# Patient Record
Sex: Female | Born: 1975
Health system: Southern US, Community
[De-identification: ages and names within clinical notes are randomized; demographics above are authoritative.]

## PROBLEM LIST (undated history)

## (undated) DIAGNOSIS — E039 Hypothyroidism, unspecified: Secondary | ICD-10-CM

## (undated) DIAGNOSIS — D649 Anemia, unspecified: Secondary | ICD-10-CM

## (undated) DIAGNOSIS — I10 Essential (primary) hypertension: Secondary | ICD-10-CM

## (undated) DIAGNOSIS — J45909 Unspecified asthma, uncomplicated: Secondary | ICD-10-CM

## (undated) DIAGNOSIS — T4145XA Adverse effect of unspecified anesthetic, initial encounter: Secondary | ICD-10-CM

## (undated) DIAGNOSIS — T8859XA Other complications of anesthesia, initial encounter: Secondary | ICD-10-CM

## (undated) HISTORY — PX: OTHER SURGICAL HISTORY: SHX169

---
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
## 2000-12-26 ENCOUNTER — Ambulatory Visit (HOSPITAL_COMMUNITY): Admission: RE | Admit: 2000-12-26 | Discharge: 2000-12-26 | Payer: Self-pay | Admitting: Ophthalmology

## 2000-12-26 ENCOUNTER — Encounter: Payer: Self-pay | Admitting: Ophthalmology

## 2001-04-09 ENCOUNTER — Ambulatory Visit (HOSPITAL_COMMUNITY): Admission: RE | Admit: 2001-04-09 | Discharge: 2001-04-09 | Payer: Self-pay | Admitting: Obstetrics & Gynecology

## 2001-10-19 ENCOUNTER — Other Ambulatory Visit: Admission: RE | Admit: 2001-10-19 | Discharge: 2001-10-19 | Payer: Self-pay | Admitting: Obstetrics & Gynecology

## 2002-05-28 ENCOUNTER — Other Ambulatory Visit: Admission: RE | Admit: 2002-05-28 | Discharge: 2002-05-28 | Payer: Self-pay | Admitting: Obstetrics & Gynecology

## 2002-12-06 ENCOUNTER — Inpatient Hospital Stay (HOSPITAL_COMMUNITY): Admission: AD | Admit: 2002-12-06 | Discharge: 2002-12-09 | Payer: Self-pay | Admitting: Obstetrics & Gynecology

## 2002-12-10 ENCOUNTER — Encounter: Admission: RE | Admit: 2002-12-10 | Discharge: 2003-01-09 | Payer: Self-pay | Admitting: Obstetrics & Gynecology

## 2003-01-10 ENCOUNTER — Other Ambulatory Visit: Admission: RE | Admit: 2003-01-10 | Discharge: 2003-01-10 | Payer: Self-pay | Admitting: Obstetrics & Gynecology

## 2004-08-17 ENCOUNTER — Inpatient Hospital Stay (HOSPITAL_COMMUNITY): Admission: AD | Admit: 2004-08-17 | Discharge: 2004-08-19 | Payer: Self-pay | Admitting: Obstetrics and Gynecology

## 2004-09-13 ENCOUNTER — Other Ambulatory Visit: Admission: RE | Admit: 2004-09-13 | Discharge: 2004-09-13 | Payer: Self-pay | Admitting: Obstetrics & Gynecology

## 2015-10-12 ENCOUNTER — Ambulatory Visit (INDEPENDENT_AMBULATORY_CARE_PROVIDER_SITE_OTHER): Payer: BLUE CROSS/BLUE SHIELD | Admitting: Sports Medicine

## 2015-10-12 VITALS — BP 134/93 | HR 93 | Resp 18 | Wt 253.0 lb

## 2015-10-12 DIAGNOSIS — Q828 Other specified congenital malformations of skin: Secondary | ICD-10-CM | POA: Diagnosis not present

## 2015-10-12 DIAGNOSIS — M2142 Flat foot [pes planus] (acquired), left foot: Secondary | ICD-10-CM | POA: Diagnosis not present

## 2015-10-12 DIAGNOSIS — M2141 Flat foot [pes planus] (acquired), right foot: Secondary | ICD-10-CM | POA: Diagnosis not present

## 2015-10-12 DIAGNOSIS — M2140 Flat foot [pes planus] (acquired), unspecified foot: Secondary | ICD-10-CM | POA: Insufficient documentation

## 2015-10-12 NOTE — Progress Notes (Signed)
    Patient was fitted for a : standard, cushioned, semi-rigid orthotic. The orthotic was heated and afterward the patient stood on the orthotic blank positioned on the orthotic stand. The patient was positioned in subtalar neutral position and 10 degrees of ankle dorsiflexion in a weight bearing stance. After completion of molding, a stable base was applied to the orthotic blank. The blank was ground to a stable position for weight bearing. Size: 9 Base: [employer] and safety inspector and Padding: None The patient ambulated these, and they were very comfortable.  Procedure:  Cryodestruction of multiple skin tags Consent obtained and verified. Time-out conducted. Noted no overlying erythema, induration, or other signs of local infection. Completed without difficulty using Cryo-Gun. Advised to call if fevers/chills, erythema, induration, drainage, or persistent bleeding.   I spent 40 minutes with this patient, greater than 50% was face-to-face time counseling regarding the below diagnosis, this was separate from the time spent performing the above procedure.

## 2015-10-12 NOTE — Assessment & Plan Note (Signed)
Custom orthotics as above. 

## 2015-10-12 NOTE — Assessment & Plan Note (Signed)
Cryotherapy of multiple skin tags on both sides of the face and neck

## 2015-10-20 ENCOUNTER — Ambulatory Visit (INDEPENDENT_AMBULATORY_CARE_PROVIDER_SITE_OTHER): Payer: BLUE CROSS/BLUE SHIELD

## 2015-10-20 ENCOUNTER — Ambulatory Visit (INDEPENDENT_AMBULATORY_CARE_PROVIDER_SITE_OTHER): Payer: BLUE CROSS/BLUE SHIELD | Admitting: Sports Medicine

## 2015-10-20 VITALS — BP 110/71 | HR 102 | Wt 258.8 lb

## 2015-10-20 DIAGNOSIS — M2142 Flat foot [pes planus] (acquired), left foot: Secondary | ICD-10-CM | POA: Diagnosis not present

## 2015-10-20 DIAGNOSIS — M170 Bilateral primary osteoarthritis of knee: Secondary | ICD-10-CM | POA: Diagnosis not present

## 2015-10-20 DIAGNOSIS — Q828 Other specified congenital malformations of skin: Secondary | ICD-10-CM | POA: Diagnosis not present

## 2015-10-20 DIAGNOSIS — M2141 Flat foot [pes planus] (acquired), right foot: Secondary | ICD-10-CM | POA: Diagnosis not present

## 2015-10-20 NOTE — Assessment & Plan Note (Signed)
Doing well with custom orthotics.

## 2015-10-20 NOTE — Assessment & Plan Note (Signed)
Scabbed over with a bit of postinflammatory hypopigmentation, otherwise doing well. I think these will look fantastic when fully healed.

## 2015-10-20 NOTE — Progress Notes (Signed)
  Subjective:    CC: Follow-up  HPI: Pes planus: All discomfort has resolved with custom orthotics.  Skin tags: After cryotherapy of multiple skin tags on the face, neck, there is some scabbing, overall doing well.  Left knee pain: Medial joint line, noticeable mechanical symptoms.no trauma, moderate, persistent, had an injection into her right knee about a month ago.  Past medical history, Surgical history, Family history not pertinant except as noted below, Social history, Allergies, and medications have been entered into the medical record, reviewed, and no changes needed.   Review of Systems: No fevers, chills, night sweats, weight loss, chest pain, or shortness of breath.   Objective:    General: Well Developed, well nourished, and in no acute distress.  Neuro: Alert and oriented x3, extra-ocular muscles intact, sensation grossly intact.  HEENT: Normocephalic, atraumatic, pupils equal round reactive to light, neck supple, no masses, no lymphadenopathy, thyroid nonpalpable. Visible scabbing of the skin tags, no sign of bacterial superinfection, there is some postinflammatory hypopigmentation. Skin: Warm and dry, no rashes. Cardiac: Regular rate and rhythm, no murmurs rubs or gallops, no lower extremity edema.  Respiratory: Clear to auscultation bilaterally. Not using accessory muscles, speaking in full sentences.  Impression and Recommendations:

## 2015-10-20 NOTE — Assessment & Plan Note (Signed)
Left knee injected approximately a month ago, doing well. Having some medial joint line pain on the right knee, suspicious for osteoarthritis with degenerative meniscal tear. X-rays, when she returns we will inject her right knee.

## 2016-09-24 DIAGNOSIS — M25562 Pain in left knee: Secondary | ICD-10-CM | POA: Diagnosis not present

## 2016-09-24 DIAGNOSIS — M25561 Pain in right knee: Secondary | ICD-10-CM | POA: Diagnosis not present

## 2016-09-24 DIAGNOSIS — R2689 Other abnormalities of gait and mobility: Secondary | ICD-10-CM | POA: Diagnosis not present

## 2016-09-24 DIAGNOSIS — G8929 Other chronic pain: Secondary | ICD-10-CM | POA: Diagnosis not present

## 2016-09-24 DIAGNOSIS — M6281 Muscle weakness (generalized): Secondary | ICD-10-CM | POA: Diagnosis not present

## 2016-11-25 ENCOUNTER — Ambulatory Visit (INDEPENDENT_AMBULATORY_CARE_PROVIDER_SITE_OTHER): Payer: Self-pay | Admitting: Orthopedic Surgery

## 2018-03-11 DIAGNOSIS — J029 Acute pharyngitis, unspecified: Secondary | ICD-10-CM | POA: Diagnosis not present

## 2018-05-01 DIAGNOSIS — E049 Nontoxic goiter, unspecified: Secondary | ICD-10-CM | POA: Diagnosis not present

## 2018-06-01 DIAGNOSIS — E041 Nontoxic single thyroid nodule: Secondary | ICD-10-CM | POA: Diagnosis not present

## 2018-08-25 ENCOUNTER — Ambulatory Visit (INDEPENDENT_AMBULATORY_CARE_PROVIDER_SITE_OTHER): Payer: BLUE CROSS/BLUE SHIELD | Admitting: Internal Medicine

## 2018-08-25 ENCOUNTER — Encounter: Payer: Self-pay | Admitting: Internal Medicine

## 2018-08-25 ENCOUNTER — Other Ambulatory Visit: Payer: Self-pay

## 2018-08-25 VITALS — BP 118/80 | HR 63 | Ht 66.0 in | Wt 218.4 lb

## 2018-08-25 DIAGNOSIS — Z9889 Other specified postprocedural states: Secondary | ICD-10-CM

## 2018-08-25 DIAGNOSIS — E041 Nontoxic single thyroid nodule: Secondary | ICD-10-CM | POA: Diagnosis not present

## 2018-08-25 LAB — TSH: TSH: 3.13 u[IU]/mL (ref 0.35–4.50)

## 2018-08-25 LAB — T4, FREE: Free T4: 0.74 ng/dL (ref 0.60–1.60)

## 2018-08-25 NOTE — Progress Notes (Signed)
Name: Denise Evans  MRN/ DOB: [id number], [birth year]    Age/ Sex: 43 y.o., female    PCP: Assunta Curtis, [place]  Reason for Endocrinology Evaluation: Thyroid nodule     Date of Initial Endocrinology Evaluation: 08/25/2018     HPI: [name] is a 43 y.o. female with a past medical history of Asthma, hypothyroidism, and HTN . The patient presented for initial endocrinology clinic visit on 08/25/2018 for consultative assistance with her thyroid nodule.   Patient was noted to have local neck enlargement on routine exam, this prompted a thyroid ultrasound which was performed on November,8th 2019, and revealed thyroid nodule requiring biopsy, images and report not available at this time. She did have an ultrasound guided FNA on 06/01/2018 of the right inferior thyroid nodule which is hypoechoic measuring 3.0 x 2.6 x 1.8 cm .  Cytology report consistent with atypia of undetermined significance Bethesda category III. it does not seem that the patient had molecular testing.  We have attempted to call the PCPs office multiple times today to obtain any information molecular testing or the actual cytology report without success.   Today she is denies any worsening in neck swelling, denies pain, voice hoarseness, or sob.  She does have occasional dysphagia.      HISTORY:   Past Medical History: Thyroid nodule  Past Surgical History:   Social History:  reports that she has never smoked. She has never used smokeless tobacco. She reports current alcohol use. She reports that she does not use drugs.  Family History: family history includes Diabetes in her father; Hypertension in her father; Uterine cancer in her mother.   HOME MEDICATIONS: Allergies as of 08/25/2018      Reactions   Norethindrone Other (See Comments)   Liver enzymes off      Medication List       Accurate as of August 25, 2018  3:55 PM. Always use your most recent med list.        meloxicam 15 MG tablet Commonly  known as:  MOBIC Take 15 mg by mouth daily.   phentermine 37.5 MG tablet Commonly known as:  ADIPEX-P TAKE 1 TABLET BY MOUTH ONCE DAILY PRIOR TO OR 1 TO 2 HOURS AFTER BREAKFAST         REVIEW OF SYSTEMS: A comprehensive ROS was conducted with the patient and is negative except as per HPI and below:  Review of Systems  Constitutional: Negative for fever.  HENT: Negative for congestion and sore throat.   Respiratory: Negative for cough and shortness of breath.   Cardiovascular: Negative for chest pain and palpitations.  Gastrointestinal: Positive for constipation and diarrhea.  Genitourinary: Negative for frequency.  Skin: Negative.   Neurological: Negative for tingling and tremors.  Endo/Heme/Allergies: Negative for polydipsia.  Psychiatric/Behavioral: Negative for depression. The patient is not nervous/anxious.        OBJECTIVE:  VS: BP 118/80 (BP Location: Left Arm, Patient Position: Sitting, Cuff Size: Normal)   Pulse 63   Ht 5\' 6"  (1.676 m)   Wt 218 lb 6.4 oz (99.1 kg)   SpO2 99%   BMI 35.25 kg/m    Wt Readings from Last 3 Encounters:  08/25/18 218 lb 6.4 oz (99.1 kg)  10/20/15 258 lb 12.8 oz (117.4 kg)  10/12/15 253 lb (114.8 kg)     EXAM: General: Pt appears well and is in NAD  Hydration: Well-hydrated with moist mucous membranes and good skin turgor  Eyes: External eye exam  normal without stare, lid lag or exophthalmos.  EOM intact.  PERRL.  Ears, Nose, Throat: Hearing: Grossly intact bilaterally Dental: Good dentition  Throat: Clear without mass, erythema or exudate  Neck: General: Supple without adenopathy. Thyroid: Thyroid size normal, right thyroid nodule noted.  No goiter appreciated. No thyroid bruit.  Lungs: Clear with good BS bilat with no rales, rhonchi, or wheezes  Heart: Auscultation: RRR.  Abdomen: Normoactive bowel sounds, soft, nontender, without masses or organomegaly palpable  Extremities:  BL LE: No pretibial edema normal ROM and  strength.  Skin: Hair: Texture and amount normal with gender appropriate distribution Skin Inspection: No rashes. Skin Palpation: Skin temperature, texture, and thickness normal to palpation  Neuro: Cranial nerves: II - XII grossly intact  Motor: Normal strength throughout DTRs: 2+ and symmetric in UE without delay in relaxation phase  Mental Status: Judgment, insight: Intact Orientation: Oriented to time, place, and person Mood and affect: No depression, anxiety, or agitation     DATA REVIEWED: 04/16/2018  FT4  0.70 ng/dL  T3 0.88 ng/mL  TSH  5.96 uIU/mL    Thyroid ultrasound 04/2018  Right mid solid nodule, isoechoic 2.9 x2.7x 2.1 cm   Impression : Heterogenous thyroid with TIAD 3 right thyroid nodule    Results for Denise, Evans (MRN [id number]) as of 08/26/2018 07:35  Ref. Range 08/25/2018 15:29  TSH Latest Ref Range: 0.35 - 4.50 uIU/mL 3.13  T4,Free(Direct) Latest Ref Range: 0.60 - 1.60 ng/dL 0.74     ASSESSMENT/PLAN/RECOMENDATIONS:   1. Right Thyroid Nodule :  -Clinically she is euthyroid -She does not have any local neck symptoms -Based on the latest ultrasound which was done the day of her FNA that thyroid nodule measurements was 3.0 x2.6x 1.8 cm in diameter, FNA report shows atypia, Bethesda category III. no molecular testing available. -I have discussed with her the option of repeating FNA with molecular testing this time.  Patient does not want to go through another biopsy and would prefer to have a right lobectomy.  Her mother passed away of uterine cancer and patient would like to eliminate any chances of cancer at this time. -We will refer patient to general surgery to discuss risk and benefits of right lobectomy. -Patient understand she will need a follow-up at our office within 6 weeks postop. -Repeat TFT's today are normal    Signed electronically by: Mack Guise, MD  [facility]  [facility] [address]  [place], [place] [address] Phone: [phone] FAX: [phone]   CC: Assunta Curtis, [address] [place] [address] Phone: [phone] Fax: [phone]   Return to Endocrinology clinic as below: Future Appointments  Date Time Provider [place]  11/03/2018  2:20 PM , Melanie Crazier, MD [facility] None

## 2018-08-25 NOTE — Patient Instructions (Signed)
-   Please stop by the lab today, we will contact you with any changes.   - We started a referral to the general surgeon, I will need to see you within 6 weeks of your surgery.

## 2018-08-26 ENCOUNTER — Encounter: Payer: Self-pay | Admitting: Internal Medicine

## 2018-08-26 DIAGNOSIS — E041 Nontoxic single thyroid nodule: Secondary | ICD-10-CM | POA: Insufficient documentation

## 2018-08-26 DIAGNOSIS — Z9889 Other specified postprocedural states: Secondary | ICD-10-CM | POA: Insufficient documentation

## 2018-09-29 DIAGNOSIS — D440 Neoplasm of uncertain behavior of thyroid gland: Secondary | ICD-10-CM | POA: Diagnosis not present

## 2018-11-02 ENCOUNTER — Telehealth: Payer: Self-pay

## 2018-11-02 NOTE — Telephone Encounter (Signed)
Left message for patient to call back and convert to virtual visit.  CRM created.

## 2018-11-03 ENCOUNTER — Other Ambulatory Visit: Payer: Self-pay

## 2018-11-03 ENCOUNTER — Encounter: Payer: Self-pay | Admitting: Internal Medicine

## 2018-11-03 ENCOUNTER — Ambulatory Visit (INDEPENDENT_AMBULATORY_CARE_PROVIDER_SITE_OTHER): Payer: BLUE CROSS/BLUE SHIELD | Admitting: Internal Medicine

## 2018-11-03 DIAGNOSIS — E041 Nontoxic single thyroid nodule: Secondary | ICD-10-CM | POA: Diagnosis not present

## 2018-11-03 DIAGNOSIS — Z9889 Other specified postprocedural states: Secondary | ICD-10-CM

## 2018-11-03 NOTE — Progress Notes (Signed)
Virtual Visit via Video Note  I connected with Denise Evans on 11/03/18 at  2:20 PM EDT by a video enabled telemedicine application and verified that I am speaking with the correct person using two identifiers.   I discussed the limitations of evaluation and management by telemedicine and the availability of in person appointments. The patient expressed understanding and agreed to proceed.  -Location of the patient : Work  -Location of the provider : Office -The names of all persons participating in the telemedicine service : Pt and myself    Name: Denise Evans  MRN/ DOB: [id number], Dec 17, 1975    Age/ Sex: 43 y.o., female     PCP: Assunta Curtis, [place]   Reason for Endocrinology Evaluation: Thyroid Nodule      Initial Endocrinology Clinic Visit: 08/25/2018    PATIENT IDENTIFIER: [name] is a 43 y.o., female with a past medical history of Asthma, and HTN . She has followed with [facility] since 08/25/2018 for consultative assistance with management of her  Thyroid Nodule.  HISTORICAL SUMMARY: The patient was noted to have local neck enlargement on routine exam, this prompted a thyroid ultrasound which was performed on November,8th 2019, and revealed thyroid nodule requiring biopsy, images and report not available at this time. She did have an ultrasound guided FNA on 06/01/2018 of the right inferior thyroid nodule which is hypoechoic measuring 3.0 x 2.6 x 1.8 cm .  Cytology report consistent with atypia of undetermined significance (Bethesda category III), no molecular testing available.   Pt opted for a right lobectomy vs repeat FNA.     SUBJECTIVE:   Today (11/03/2018):  Denise Evans is here for virtual 2 month follow up on right thyroid nodule.  She denies any worsening in neck swelling,  pain, or voice hoarseness. She has noted more fatigue then usual. She has chronic constipation.  Weight has been stable. Denies palpitations or depression.   ROS:  As  per HPI.   HISTORY:   Past Medical History: No past medical history on file. Past Surgical History:  Past Surgical History:  Procedure Laterality Date  . D & C    . hystrectomy       Social History:  reports that she has never smoked. She has never used smokeless tobacco. She reports current alcohol use. She reports that she does not use drugs. Family History:  Family History  Problem Relation Age of Onset  . Uterine cancer Mother   . Diabetes Father   . Hypertension Father       HOME MEDICATIONS: Allergies as of 11/03/2018      Reactions   Norethindrone Other (See Comments)   Liver enzymes off      Medication List       Accurate as of Nov 03, 2018  1:57 PM. If you have any questions, ask your nurse or doctor.        meloxicam 15 MG tablet Commonly known as:  MOBIC Take 15 mg by mouth daily.   phentermine 37.5 MG tablet Commonly known as:  ADIPEX-P TAKE 1 TABLET BY MOUTH ONCE DAILY PRIOR TO OR 1 TO 2 HOURS AFTER BREAKFAST          DATA REVIEWED:   Results for Denise Evans, Denise Evans (MRN [id number]) as of 11/03/2018 14:54  Ref. Range 08/25/2018 15:29  TSH Latest Ref Range: 0.35 - 4.50 uIU/mL 3.13  T4,Free(Direct) Latest Ref Range: 0.60 - 1.60 ng/dL 0.74    ASSESSMENT / PLAN / RECOMMENDATIONS:  1. Right Thyroid Nodule , S/P FNA with AUS  - Pt is clinically and biochemically euthyroid  - Based on the latest ultrasound which was done the day of her FNA that thyroid nodule measurements was 3.0 x2.6x 1.8 cm in diameter, FNA report shows atypia, Bethesda category III. no molecular testing available. - Pt opted to proceed with right lobectomy rather then repeat FNA, she met with Dr. Delana Meyer but due to COVID-19 restrictions, her surgery is on hold.  - No local neck symptoms at this time.     I discussed the assessment and treatment plan with the patient. The patient was provided an opportunity to ask questions and all were answered. The patient agreed with the  plan and demonstrated an understanding of the instructions.   The patient was advised to call back or seek an in-person evaluation if the symptoms worsen or if the condition fails to improve as anticipated.  F/u 6 weeks post-Op    Signed electronically by: Mack Guise, MD  [facility]  [facility] [address] [place], [place] [address] Phone: [phone] FAX: [phone]   CC: Assunta Curtis, [address] [place] [address] Phone: [phone] Fax: [phone]   Return to Endocrinology clinic as below: Future Appointments  Date Time Provider [place]  11/03/2018  2:20 PM Shamle[place], Melanie Crazier, MD [facility] None

## 2018-11-18 ENCOUNTER — Encounter: Payer: Self-pay | Admitting: Surgery

## 2018-11-18 ENCOUNTER — Ambulatory Visit: Payer: Self-pay | Admitting: Surgery

## 2018-11-18 DIAGNOSIS — D440 Neoplasm of uncertain behavior of thyroid gland: Secondary | ICD-10-CM | POA: Diagnosis present

## 2018-11-18 NOTE — H&P (Signed)
General Surgery [facility]  Denise Evans DOB: April 21, 1976 Married / Language: English / Race: Black or African American Female   History of Present Illness  The patient is a 43 year old female who presents with a thyroid nodule.  CHIEF COMPLAINT: right thyroid nodule with atypia  Patient is referred by Dr. Vivia Ewing for surgical evaluation and management of a dominant right thyroid nodule with cytologic atypia. Patient's primary care is provided by Candace Gallus, NP, in [place], [place]. Patient was found in the fall of 2019 to have a palpable thyroid nodule on physical examination. Patient subsequently underwent thyroid ultrasound at the [facility] in [place]. This demonstrated a 2.9 x 2.7 x 2.1 cm solid nodule in the mid right thyroid lobe. This was felt to be mildly suspicious and biopsy was recommended. On June 02, 2018 the patient underwent fine-needle aspiration biopsy. This showed cytologic atypia of undetermined significance. Patient was referred to endocrinology and evaluated. She is now referred to surgery for consideration for surgical resection for definitive diagnosis and management. Patient did not have molecular genetic testing. Patient has had no prior history of head or neck surgery. She has taken thyroid hormone in the past for polycystic ovarian syndrome. She has been off of that medication now for many years. There is no family history of thyroid disease. Patient does note some mild compressive symptoms with occasional dysphagia to both solids and liquids. She notes occasional voice quality changes. She is very aware of the cosmetic appearance with fullness in the right anterior neck. She presents today to discuss possible surgical removal.   Past Surgical History  Hysterectomy (not due to cancer) - Complete   Diagnostic Studies History  Colonoscopy  never Mammogram  1-3 years ago Pap Smear  1-5 years  ago  Allergies  Norethindrone *CHEMICALS*   Medication History  No Current Medications Medications Reconciled  Social History  Alcohol use  Occasional alcohol use. Caffeine use  Coffee. No drug use  Tobacco use  Never smoker.  Family History  Alcohol Abuse  Brother, Father. Bleeding disorder  Mother. Cancer  Mother. Diabetes Mellitus  Father, Mother. Heart Disease  Father, Mother. Heart disease in female family member before age 5  Hypertension  Father, Mother. Kidney Disease  Mother.  Pregnancy / Birth History  Age at menarche  48 years. Gravida  5 Irregular periods  Length (months) of breastfeeding  3-6 Maternal age  58-20 Para  2  Other Problems Asthma  Heart murmur   Review of Systems General Present- Weight Gain. Not Present- Appetite Loss, Chills, Fatigue, Fever, Night Sweats and Weight Loss. Skin Present- Dryness. Not Present- Change in Wart/Mole, Hives, Jaundice, New Lesions, Non-Healing Wounds, Rash and Ulcer. HEENT Present- Seasonal Allergies. Not Present- Earache, Hearing Loss, Hoarseness, Nose Bleed, Oral Ulcers, Ringing in the Ears, Sinus Pain, Sore Throat, Visual Disturbances, Wears glasses/contact lenses and Yellow Eyes. Respiratory Not Present- Bloody sputum, Chronic Cough, Difficulty Breathing, Snoring and Wheezing. Breast Not Present- Breast Mass, Breast Pain, Nipple Discharge and Skin Changes. Cardiovascular Not Present- Chest Pain, Difficulty Breathing Lying Down, Leg Cramps, Palpitations, Rapid Heart Rate, Shortness of Breath and Swelling of Extremities. Gastrointestinal Not Present- Abdominal Pain, Bloating, Bloody Stool, Change in Bowel Habits, Chronic diarrhea, Constipation, Difficulty Swallowing, Excessive gas, Gets full quickly at meals, Hemorrhoids, Indigestion, Nausea, Rectal Pain and Vomiting. Female Genitourinary Not Present- Frequency, Nocturia, Painful Urination, Pelvic Pain and Urgency. Musculoskeletal Not Present-  Back Pain, Joint Pain, Joint Stiffness, Muscle Pain, Muscle Weakness  and Swelling of Extremities. Neurological Not Present- Decreased Memory, Fainting, Headaches, Numbness, Seizures, Tingling, Tremor, Trouble walking and Weakness. Psychiatric Not Present- Anxiety, Bipolar, Change in Sleep Pattern, Depression, Fearful and Frequent crying. Endocrine Not Present- Cold Intolerance, Excessive Hunger, Hair Changes, Heat Intolerance, Hot flashes and New Diabetes. Hematology Not Present- Blood Thinners, Easy Bruising, Excessive bleeding, Gland problems, HIV and Persistent Infections.  Vitals  Weight: 223.13 lb Height: 66in Body Surface Area: 2.1 m Body Mass Index: 36.01 kg/m  Temp.: 99.31F  Pulse: 81 (Regular)  P.OX: 99% (Room air) BP: 130/80 (Sitting, Left Arm, Standard)   Physical Exam   See vital signs recorded above  GENERAL APPEARANCE Development: normal Nutritional status: normal Gross deformities: none  SKIN Rash, lesions, ulcers: none Induration, erythema: none Nodules: none palpable  EYES Conjunctiva and lids: normal Pupils: equal and reactive Iris: normal bilaterally  EARS, NOSE, MOUTH, THROAT External ears: no lesion or deformity External nose: no lesion or deformity Hearing: grossly normal Lips: no lesion or deformity Dentition: normal for age Oral mucosa: moist  NECK Symmetric: no Trachea: midline Thyroid: There is visible fullness in the right anterior neck. Palpation of the right lobe shows a firm discrete nodule measuring approximately 3 cm in size, mobile with swallowing, and nontender. Left thyroid lobe is without palpable abnormality. No palpable lymphadenopathy.  CHEST Respiratory effort: normal Retraction or accessory muscle use: no Breath sounds: normal bilaterally Rales, rhonchi, wheeze: none  CARDIOVASCULAR Auscultation: regular rhythm, normal rate Murmurs: none Pulses: carotid and radial pulse 2+ palpable Lower extremity edema:  none Lower extremity varicosities: none  MUSCULOSKELETAL Station and gait: normal Digits and nails: no clubbing or cyanosis Muscle strength: grossly normal all extremities Range of motion: grossly normal all extremities Deformity: none  LYMPHATIC Cervical: none palpable Supraclavicular: none palpable  PSYCHIATRIC Oriented to person, place, and time: yes Mood and affect: normal for situation Judgment and insight: appropriate for situation    Assessment & Plan  NEOPLASM OF UNCERTAIN BEHAVIOR OF THYROID GLAND (D44.0)  Pt Education - Pamphlet Given - The Thyroid Book: discussed with patient and provided information.  Patient presents on referral from her endocrinologist for evaluation for surgery for management of right thyroid nodule with cytologic atypia. Patient is provided with written literature on thyroid surgery to review at home.  Patient has a dominant right sided thyroid nodule measuring 2.9 cm in greatest dimension. Patient has some mild compressive symptoms. Cytopathology demonstrates atypia. We discussed further options for management including repeat biopsy for molecular genetic testing versus proceeding directly to surgery. Patient states that given the mild compressive symptoms and the cosmetic appearance, she prefers to have surgery for definitive diagnosis. We discussed the risk and benefits of the procedure including the risk of injury to recurrent laryngeal nerve and parathyroid glands. We discussed the potential need for completion thyroidectomy if she undergoes right thyroid lobectomy as the initial procedure. We discussed the potential need for radioactive iodine treatment based on final pathology results. We discussed the location of the surgical incision. We discussed the hospital stay to be anticipated. We discussed her postoperative recovery and return to work and activities. Patient understands and wishes to proceed with surgery in the near  future.  Due to the current virus pandemic, surgical procedures of this tight our currently on hold. We will submit orders and placed the patient in the queue for surgical scheduling as soon as the operating rooms are available for this type of procedure. Patient understands.  The risks and benefits of the procedure  have been discussed at length with the patient. The patient understands the proposed procedure, potential alternative treatments, and the course of recovery to be expected. All of the patient's questions have been answered at this time. The patient wishes to proceed with surgery.   Armandina Gemma, [facility] Office: [phone]

## 2018-12-29 ENCOUNTER — Other Ambulatory Visit (HOSPITAL_COMMUNITY)
Admission: RE | Admit: 2018-12-29 | Discharge: 2018-12-29 | Disposition: A | Discharge status: Home / Self Care | Payer: BC Managed Care – PPO | Source: Ambulatory Visit | Attending: Surgery | Admitting: Surgery

## 2018-12-29 DIAGNOSIS — Z1159 Encounter for screening for other viral diseases: Secondary | ICD-10-CM | POA: Diagnosis not present

## 2018-12-29 DIAGNOSIS — Z01812 Encounter for preprocedural laboratory examination: Secondary | ICD-10-CM | POA: Diagnosis not present

## 2018-12-29 LAB — SARS CORONAVIRUS 2 (TAT 6-24 HRS): SARS Coronavirus 2: NEGATIVE

## 2018-12-29 NOTE — Patient Instructions (Addendum)
YOU HAD A COVID 19 TEST ON 12-29-2018,  ONCE YOUR COVID TEST IS COMPLETED, PLEASE BEGIN THE QUARANTINE INSTRUCTIONS AS OUTLINED IN YOUR HANDOUT.                Raeanne Legate    Your procedure is scheduled on: 01-01-2019   Report to [facility] Main  Entrance    Report to  [place] at 530 AM    Call this number if you have problems the morning of surgery [phone]    Remember: Do not eat food or drink liquids :After Midnight.    Take these medicines the morning of surgery with A SIP OF WATER: None   BRUSH YOUR TEETH MORNING OF SURGERY AND RINSE YOUR MOUTH OUT, NO CHEWING GUM CANDY OR MINTS.                                You may not have any metal on your body including hair pins and              piercings    Do not wear jewelry, makeup,  lotions, powders or deodorant               Do not wear nail polish.  Do not shave  48 hours prior to surgery.                Do not bring valuables to the hospital. [place].  Contacts, dentures or bridgework may not be worn into surgery.       _____________________________________________________________________             [facility] - Preparing for Surgery Before surgery, you can play an important role.  Because skin is not sterile, your skin needs to be as free of germs as possible.  You can reduce the number of germs on your skin by washing with CHG (chlorahexidine gluconate) soap before surgery.  CHG is an antiseptic cleaner which kills germs and bonds with the skin to continue killing germs even after washing. Please DO NOT use if you have an allergy to CHG or antibacterial soaps.  If your skin becomes reddened/irritated stop using the CHG and inform your nurse when you arrive at Short Stay. Do not shave (including legs and underarms) for at least 48 hours prior to the first CHG shower.  You may shave your face/neck. Please follow these instructions carefully:  1.   Shower with CHG Soap the night before surgery and the  morning of Surgery.  2.  If you choose to wash your hair, wash your hair first as usual with your  normal  shampoo.  3.  After you shampoo, rinse your hair and body thoroughly to remove the  shampoo.                           4.  Use CHG as you would any other liquid soap.  You can apply chg directly  to the skin and wash                       Gently with a scrungie or clean washcloth.  5.  Apply the CHG Soap to your body ONLY FROM THE NECK DOWN.   Do not use on face/  open                           Wound or open sores. Avoid contact with eyes, ears mouth and genitals (private parts).                       Wash face,  Genitals (private parts) with your normal soap.             6.  Wash thoroughly, paying special attention to the area where your surgery  will be performed.  7.  Thoroughly rinse your body with warm water from the neck down.  8.  DO NOT shower/wash with your normal soap after using and rinsing off  the CHG Soap.                9.  Pat yourself dry with a clean towel.            10.  Wear clean pajamas.            11.  Place clean sheets on your bed the night of your first shower and do not  sleep with pets. Day of Surgery : Do not apply any lotions/deodorants the morning of surgery.  Please wear clean clothes to the hospital/surgery center.  FAILURE TO FOLLOW THESE INSTRUCTIONS MAY RESULT IN THE CANCELLATION OF YOUR SURGERY PATIENT SIGNATURE_________________________________  NURSE SIGNATURE__________________________________  ________________________________________________________________________

## 2018-12-30 ENCOUNTER — Encounter (HOSPITAL_COMMUNITY): Payer: Self-pay

## 2018-12-30 ENCOUNTER — Encounter (HOSPITAL_COMMUNITY)
Admission: RE | Admit: 2018-12-30 | Discharge: 2018-12-30 | Disposition: A | Discharge status: Home / Self Care | Payer: BC Managed Care – PPO | Source: Ambulatory Visit | Attending: Surgery | Admitting: Surgery

## 2018-12-30 ENCOUNTER — Other Ambulatory Visit: Payer: Self-pay

## 2018-12-30 ENCOUNTER — Encounter (HOSPITAL_COMMUNITY): Payer: Self-pay | Admitting: Surgery

## 2018-12-30 ENCOUNTER — Ambulatory Visit (HOSPITAL_COMMUNITY)
Admission: RE | Admit: 2018-12-30 | Discharge: 2018-12-30 | Disposition: A | Discharge status: Home / Self Care | Payer: BC Managed Care – PPO | Source: Ambulatory Visit | Attending: Anesthesiology | Admitting: Anesthesiology

## 2018-12-30 DIAGNOSIS — Z01811 Encounter for preprocedural respiratory examination: Secondary | ICD-10-CM | POA: Insufficient documentation

## 2018-12-30 HISTORY — DX: Other complications of anesthesia, initial encounter: T88.59XA

## 2018-12-30 HISTORY — DX: Essential (primary) hypertension: I10

## 2018-12-30 HISTORY — DX: Hypothyroidism, unspecified: E03.9

## 2018-12-30 HISTORY — DX: Unspecified asthma, uncomplicated: J45.909

## 2018-12-30 HISTORY — DX: Anemia, unspecified: D64.9

## 2018-12-30 LAB — CBC
HCT: 41.9 % (ref 36.0–46.0)
Hemoglobin: 13.1 g/dL (ref 12.0–15.0)
MCH: 29 pg (ref 26.0–34.0)
MCHC: 31.3 g/dL (ref 30.0–36.0)
MCV: 92.9 fL (ref 80.0–100.0)
Platelets: 222 10*3/uL (ref 150–400)
RBC: 4.51 MIL/uL (ref 3.87–5.11)
RDW: 13.7 % (ref 11.5–15.5)
WBC: 6.3 10*3/uL (ref 4.0–10.5)
nRBC: 0 % (ref 0.0–0.2)

## 2018-12-30 NOTE — H&P (Signed)
General Surgery [facility]  Lainie Overacker DOB: 10-03-1975 Married / Language: English / Race: Black or African American Female   History of Present Illness  The patient is a 43 year old female who presents with a thyroid nodule.  CHIEF COMPLAINT: right thyroid nodule with atypia  Patient is referred by Dr. Vivia Ewing for surgical evaluation and management of a dominant right thyroid nodule with cytologic atypia. Patient's primary care is provided by Candace Gallus, NP, in [place], [place]. Patient was found in the fall of 2019 to have a palpable thyroid nodule on physical examination. Patient subsequently underwent thyroid ultrasound at the [facility] in [place]. This demonstrated a 2.9 x 2.7 x 2.1 cm solid nodule in the mid right thyroid lobe. This was felt to be mildly suspicious and biopsy was recommended. On June 02, 2018 the patient underwent fine-needle aspiration biopsy. This showed cytologic atypia of undetermined significance. Patient was referred to endocrinology and evaluated. She is now referred to surgery for consideration for surgical resection for definitive diagnosis and management. Patient did not have molecular genetic testing. Patient has had no prior history of head or neck surgery. She has taken thyroid hormone in the past for polycystic ovarian syndrome. She has been off of that medication now for many years. There is no family history of thyroid disease. Patient does note some mild compressive symptoms with occasional dysphagia to both solids and liquids. She notes occasional voice quality changes. She is very aware of the cosmetic appearance with fullness in the right anterior neck. She presents today to discuss possible surgical removal.   Past Surgical History Hysterectomy (not due to cancer) - Complete   Diagnostic Studies History Colonoscopy  never Mammogram  1-3 years ago Pap Smear  1-5 years  ago  Allergies  Norethindrone *CHEMICALS*   Medication History No Current Medications Medications Reconciled  Social History  Alcohol use  Occasional alcohol use. Caffeine use  Coffee. No drug use  Tobacco use  Never smoker.  Family History Alcohol Abuse  Brother, Father. Bleeding disorder  Mother. Cancer  Mother. Diabetes Mellitus  Father, Mother. Heart Disease  Father, Mother. Heart disease in female family member before age 95  Hypertension  Father, Mother. Kidney Disease  Mother.  Pregnancy / Birth History Age at menarche  42 years. Gravida  5 Irregular periods  Length (months) of breastfeeding  3-6 Maternal age  71-20 Para  2  Other Problems Asthma  Heart murmur   Review of Systems General Present- Weight Gain. Not Present- Appetite Loss, Chills, Fatigue, Fever, Night Sweats and Weight Loss. Skin Present- Dryness. Not Present- Change in Wart/Mole, Hives, Jaundice, New Lesions, Non-Healing Wounds, Rash and Ulcer. HEENT Present- Seasonal Allergies. Not Present- Earache, Hearing Loss, Hoarseness, Nose Bleed, Oral Ulcers, Ringing in the Ears, Sinus Pain, Sore Throat, Visual Disturbances, Wears glasses/contact lenses and Yellow Eyes. Respiratory Not Present- Bloody sputum, Chronic Cough, Difficulty Breathing, Snoring and Wheezing. Breast Not Present- Breast Mass, Breast Pain, Nipple Discharge and Skin Changes. Cardiovascular Not Present- Chest Pain, Difficulty Breathing Lying Down, Leg Cramps, Palpitations, Rapid Heart Rate, Shortness of Breath and Swelling of Extremities. Gastrointestinal Not Present- Abdominal Pain, Bloating, Bloody Stool, Change in Bowel Habits, Chronic diarrhea, Constipation, Difficulty Swallowing, Excessive gas, Gets full quickly at meals, Hemorrhoids, Indigestion, Nausea, Rectal Pain and Vomiting. Female Genitourinary Not Present- Frequency, Nocturia, Painful Urination, Pelvic Pain and Urgency. Musculoskeletal Not Present-  Back Pain, Joint Pain, Joint Stiffness, Muscle Pain, Muscle Weakness and Swelling of Extremities. Neurological  Not Present- Decreased Memory, Fainting, Headaches, Numbness, Seizures, Tingling, Tremor, Trouble walking and Weakness. Psychiatric Not Present- Anxiety, Bipolar, Change in Sleep Pattern, Depression, Fearful and Frequent crying. Endocrine Not Present- Cold Intolerance, Excessive Hunger, Hair Changes, Heat Intolerance, Hot flashes and New Diabetes. Hematology Not Present- Blood Thinners, Easy Bruising, Excessive bleeding, Gland problems, HIV and Persistent Infections.  Vitals Weight: 223.13 lb Height: 66in Body Surface Area: 2.1 m Body Mass Index: 36.01 kg/m  Temp.: 99.4F  Pulse: 81 (Regular)  P.OX: 99% (Room air) BP: 130/80(Sitting, Left Arm, Standard)  Physical Exam  See vital signs recorded above  GENERAL APPEARANCE Development: normal Nutritional status: normal Gross deformities: none  SKIN Rash, lesions, ulcers: none Induration, erythema: none Nodules: none palpable  EYES Conjunctiva and lids: normal Pupils: equal and reactive Iris: normal bilaterally  EARS, NOSE, MOUTH, THROAT External ears: no lesion or deformity External nose: no lesion or deformity Hearing: grossly normal Lips: no lesion or deformity Dentition: normal for age Oral mucosa: moist  NECK Symmetric: no Trachea: midline Thyroid: There is visible fullness in the right anterior neck. Palpation of the right lobe shows a firm discrete nodule measuring approximately 3 cm in size, mobile with swallowing, and nontender. Left thyroid lobe is without palpable abnormality. No palpable lymphadenopathy.  CHEST Respiratory effort: normal Retraction or accessory muscle use: no Breath sounds: normal bilaterally Rales, rhonchi, wheeze: none  CARDIOVASCULAR Auscultation: regular rhythm, normal rate Murmurs: none Pulses: carotid and radial pulse 2+ palpable Lower extremity edema:  none Lower extremity varicosities: none  MUSCULOSKELETAL Station and gait: normal Digits and nails: no clubbing or cyanosis Muscle strength: grossly normal all extremities Range of motion: grossly normal all extremities Deformity: none  LYMPHATIC Cervical: none palpable Supraclavicular: none palpable  PSYCHIATRIC Oriented to person, place, and time: yes Mood and affect: normal for situation Judgment and insight: appropriate for situation    Assessment & Plan   NEOPLASM OF UNCERTAIN BEHAVIOR OF THYROID GLAND (D44.0)  Pt Education - Pamphlet Given - The Thyroid Book: discussed with patient and provided information. Patient presents on referral from her endocrinologist for evaluation for surgery for management of right thyroid nodule with cytologic atypia. Patient is provided with written literature on thyroid surgery to review at home.  Patient has a dominant right sided thyroid nodule measuring 2.9 cm in greatest dimension. Patient has some mild compressive symptoms. Cytopathology demonstrates atypia. We discussed further options for management including repeat biopsy for molecular genetic testing versus proceeding directly to surgery. Patient states that given the mild compressive symptoms and the cosmetic appearance, she prefers to have surgery for definitive diagnosis. We discussed the risk and benefits of the procedure including the risk of injury to recurrent laryngeal nerve and parathyroid glands. We discussed the potential need for completion thyroidectomy if she undergoes right thyroid lobectomy as the initial procedure. We discussed the potential need for radioactive iodine treatment based on final pathology results. We discussed the location of the surgical incision. We discussed the hospital stay to be anticipated. We discussed her postoperative recovery and return to work and activities. Patient understands and wishes to proceed with surgery in the near  future.  Due to the current virus pandemic, surgical procedures of this tight our currently on hold. We will submit orders and placed the patient in the queue for surgical scheduling as soon as the operating rooms are available for this type of procedure. Patient understands.  The risks and benefits of the procedure have been discussed at length with the patient. The  patient understands the proposed procedure, potential alternative treatments, and the course of recovery to be expected. All of the patient's questions have been answered at this time. The patient wishes to proceed with surgery.   Armandina Gemma, [facility] Office: [phone]

## 2018-12-31 NOTE — Anesthesia Preprocedure Evaluation (Addendum)
Anesthesia Evaluation  Patient identified by MRN, date of birth, ID band Patient awake    Reviewed: Allergy & Precautions, NPO status , Patient's Chart, lab work & pertinent test results  Airway Mallampati: II       Dental no notable dental hx. (+) Teeth Intact   Pulmonary asthma ,    Pulmonary exam normal breath sounds clear to auscultation       Cardiovascular hypertension, Normal cardiovascular exam Rhythm:Regular Rate:Normal     Neuro/Psych negative neurological ROS  negative psych ROS   GI/Hepatic negative GI ROS, Neg liver ROS,   Endo/Other  Hypothyroidism   Renal/GU      Musculoskeletal   Abdominal (+) + obese,   Peds  Hematology   Anesthesia Other Findings   Reproductive/Obstetrics                            Anesthesia Physical Anesthesia Plan  ASA: II  Anesthesia Plan: General   Post-op Pain Management:    Induction: Intravenous  PONV Risk Score and Plan: 4 or greater and Ondansetron, Dexamethasone, Midazolam and Scopolamine patch - Pre-op  Airway Management Planned: Oral ETT  Additional Equipment:   Intra-op Plan:   Post-operative Plan: Extubation in OR  Informed Consent: I have reviewed the patients History and Physical, chart, labs and discussed the procedure including the risks, benefits and alternatives for the proposed anesthesia with the patient or authorized representative who has indicated his/her understanding and acceptance.     Dental advisory given  Plan Discussed with: CRNA  Anesthesia Plan Comments:        Anesthesia Quick Evaluation

## 2019-01-01 ENCOUNTER — Other Ambulatory Visit: Payer: Self-pay

## 2019-01-01 ENCOUNTER — Ambulatory Visit (HOSPITAL_COMMUNITY): Payer: BC Managed Care – PPO | Admitting: Physician Assistant

## 2019-01-01 ENCOUNTER — Ambulatory Visit (HOSPITAL_COMMUNITY): Payer: BC Managed Care – PPO | Admitting: Anesthesiology

## 2019-01-01 ENCOUNTER — Encounter (HOSPITAL_COMMUNITY): Payer: Self-pay | Admitting: Emergency Medicine

## 2019-01-01 ENCOUNTER — Encounter (HOSPITAL_COMMUNITY)
Admission: RE | Disposition: A | Discharge status: Home / Self Care | Payer: Self-pay | Source: Home / Self Care | Attending: Surgery

## 2019-01-01 ENCOUNTER — Observation Stay (HOSPITAL_COMMUNITY)
Admission: RE | Admit: 2019-01-01 | Discharge: 2019-01-02 | Disposition: A | Discharge status: Home / Self Care | Payer: BC Managed Care – PPO | Attending: Surgery | Admitting: Surgery

## 2019-01-01 DIAGNOSIS — Z888 Allergy status to other drugs, medicaments and biological substances status: Secondary | ICD-10-CM | POA: Diagnosis not present

## 2019-01-01 DIAGNOSIS — D440 Neoplasm of uncertain behavior of thyroid gland: Secondary | ICD-10-CM | POA: Diagnosis not present

## 2019-01-01 DIAGNOSIS — E049 Nontoxic goiter, unspecified: Secondary | ICD-10-CM | POA: Insufficient documentation

## 2019-01-01 DIAGNOSIS — D34 Benign neoplasm of thyroid gland: Secondary | ICD-10-CM | POA: Diagnosis not present

## 2019-01-01 DIAGNOSIS — Z79899 Other long term (current) drug therapy: Secondary | ICD-10-CM | POA: Diagnosis not present

## 2019-01-01 DIAGNOSIS — E039 Hypothyroidism, unspecified: Secondary | ICD-10-CM | POA: Diagnosis not present

## 2019-01-01 DIAGNOSIS — E041 Nontoxic single thyroid nodule: Secondary | ICD-10-CM | POA: Diagnosis present

## 2019-01-01 DIAGNOSIS — I10 Essential (primary) hypertension: Secondary | ICD-10-CM | POA: Diagnosis not present

## 2019-01-01 DIAGNOSIS — J45909 Unspecified asthma, uncomplicated: Secondary | ICD-10-CM | POA: Diagnosis not present

## 2019-01-01 DIAGNOSIS — R1310 Dysphagia, unspecified: Secondary | ICD-10-CM | POA: Diagnosis not present

## 2019-01-01 DIAGNOSIS — M170 Bilateral primary osteoarthritis of knee: Secondary | ICD-10-CM | POA: Diagnosis not present

## 2019-01-01 HISTORY — PX: THYROID LOBECTOMY: SHX420

## 2019-01-01 LAB — BASIC METABOLIC PANEL
Anion gap: 8 (ref 5–15)
BUN: 13 mg/dL (ref 6–20)
CO2: 24 mmol/L (ref 22–32)
Calcium: 8.8 mg/dL — ABNORMAL LOW (ref 8.9–10.3)
Chloride: 106 mmol/L (ref 98–111)
Creatinine, Ser: 0.79 mg/dL (ref 0.44–1.00)
GFR calc Af Amer: >60 mL/min (ref >60)
GFR calc non Af Amer: >60 mL/min (ref >60)
Glucose, Bld: 99 mg/dL (ref 70–99)
Potassium: 4 mmol/L (ref 3.5–5.1)
Sodium: 138 mmol/L (ref 135–145)

## 2019-01-01 SURGERY — LOBECTOMY, THYROID
Anesthesia: General | Laterality: Right

## 2019-01-01 MED ORDER — EPHEDRINE 5 MG/ML INJ
INTRAVENOUS | Status: AC
  Filled 2019-01-01: qty 10

## 2019-01-01 MED ORDER — PROPOFOL 10 MG/ML IV BOLUS
INTRAVENOUS | Status: AC
  Filled 2019-01-01: qty 40

## 2019-01-01 MED ORDER — PROMETHAZINE HCL 25 MG/ML IJ SOLN: 6.2500 mg | INTRAMUSCULAR | Status: DC | PRN

## 2019-01-01 MED ORDER — ACETAMINOPHEN 650 MG RE SUPP: 650.0000 mg | Freq: Four times a day (QID) | RECTAL | Status: DC | PRN

## 2019-01-01 MED ORDER — PHENYLEPHRINE 40 MCG/ML (10ML) SYRINGE FOR IV PUSH (FOR BLOOD PRESSURE SUPPORT)
PREFILLED_SYRINGE | INTRAVENOUS | Status: DC | PRN
  Administered 2019-01-01 (×3): 80 ug via INTRAVENOUS

## 2019-01-01 MED ORDER — ROCURONIUM BROMIDE 10 MG/ML (PF) SYRINGE
PREFILLED_SYRINGE | INTRAVENOUS | Status: DC | PRN
  Administered 2019-01-01: 70 mg via INTRAVENOUS

## 2019-01-01 MED ORDER — FENTANYL CITRATE (PF) 100 MCG/2ML IJ SOLN
INTRAMUSCULAR | Status: AC
  Filled 2019-01-01: qty 2

## 2019-01-01 MED ORDER — HYDROCHLOROTHIAZIDE 12.5 MG PO CAPS
12.5000 mg | ORAL_CAPSULE | Freq: Every day | ORAL | Status: DC
  Administered 2019-01-01: 12.5 mg via ORAL
  Filled 2019-01-01: qty 1

## 2019-01-01 MED ORDER — ALBUTEROL SULFATE HFA 108 (90 BASE) MCG/ACT IN AERS
INHALATION_SPRAY | RESPIRATORY_TRACT | Status: DC | PRN
  Administered 2019-01-01: 4 via RESPIRATORY_TRACT
  Administered 2019-01-01: 6 via RESPIRATORY_TRACT

## 2019-01-01 MED ORDER — EPHEDRINE SULFATE-NACL 50-0.9 MG/10ML-% IV SOSY
PREFILLED_SYRINGE | INTRAVENOUS | Status: DC | PRN
  Administered 2019-01-01: 10 mg via INTRAVENOUS
  Administered 2019-01-01: 5 mg via INTRAVENOUS
  Administered 2019-01-01: 10 mg via INTRAVENOUS

## 2019-01-01 MED ORDER — HYDROCODONE-ACETAMINOPHEN 5-325 MG PO TABS
1.0000 | ORAL_TABLET | ORAL | Status: DC | PRN
  Administered 2019-01-01: 1 via ORAL
  Filled 2019-01-01: qty 1

## 2019-01-01 MED ORDER — LIDOCAINE 2% (20 MG/ML) 5 ML SYRINGE
INTRAMUSCULAR | Status: DC | PRN
  Administered 2019-01-01: 100 mg via INTRAVENOUS

## 2019-01-01 MED ORDER — LISINOPRIL 10 MG PO TABS: 10.0000 mg | ORAL_TABLET | Freq: Every day | ORAL | Status: DC

## 2019-01-01 MED ORDER — CEFAZOLIN SODIUM-DEXTROSE 2-4 GM/100ML-% IV SOLN
2.0000 g | INTRAVENOUS | Status: AC
  Administered 2019-01-01: 2 g via INTRAVENOUS
  Filled 2019-01-01: qty 100

## 2019-01-01 MED ORDER — ONDANSETRON HCL 4 MG/2ML IJ SOLN
INTRAMUSCULAR | Status: DC | PRN
  Administered 2019-01-01: 4 mg via INTRAVENOUS

## 2019-01-01 MED ORDER — MEPERIDINE HCL 50 MG/ML IJ SOLN: 6.2500 mg | INTRAMUSCULAR | Status: DC | PRN

## 2019-01-01 MED ORDER — LACTATED RINGERS IV SOLN
INTRAVENOUS | Status: DC
  Administered 2019-01-01: 06:00:00 via INTRAVENOUS

## 2019-01-01 MED ORDER — PROMETHAZINE HCL 25 MG/ML IJ SOLN
12.5000 mg | INTRAMUSCULAR | Status: DC | PRN
  Administered 2019-01-01: 12.5 mg via INTRAVENOUS
  Filled 2019-01-01: qty 1

## 2019-01-01 MED ORDER — ALBUTEROL SULFATE HFA 108 (90 BASE) MCG/ACT IN AERS
INHALATION_SPRAY | RESPIRATORY_TRACT | Status: AC
  Filled 2019-01-01: qty 6.7

## 2019-01-01 MED ORDER — SUGAMMADEX SODIUM 500 MG/5ML IV SOLN
INTRAVENOUS | Status: DC | PRN
  Administered 2019-01-01: 400 mg via INTRAVENOUS

## 2019-01-01 MED ORDER — HYDROMORPHONE HCL 1 MG/ML IJ SOLN
0.2500 mg | INTRAMUSCULAR | Status: DC | PRN
  Administered 2019-01-01 (×2): 0.5 mg via INTRAVENOUS

## 2019-01-01 MED ORDER — DEXAMETHASONE SODIUM PHOSPHATE 10 MG/ML IJ SOLN
INTRAMUSCULAR | Status: DC | PRN
  Administered 2019-01-01: 10 mg via INTRAVENOUS

## 2019-01-01 MED ORDER — SUGAMMADEX SODIUM 500 MG/5ML IV SOLN
INTRAVENOUS | Status: AC
  Filled 2019-01-01: qty 5

## 2019-01-01 MED ORDER — KETOROLAC TROMETHAMINE 30 MG/ML IJ SOLN: 30.0000 mg | Freq: Once | INTRAMUSCULAR | Status: DC | PRN

## 2019-01-01 MED ORDER — PHENYLEPHRINE 40 MCG/ML (10ML) SYRINGE FOR IV PUSH (FOR BLOOD PRESSURE SUPPORT)
PREFILLED_SYRINGE | INTRAVENOUS | Status: AC
  Filled 2019-01-01: qty 10

## 2019-01-01 MED ORDER — PROPOFOL 10 MG/ML IV BOLUS
INTRAVENOUS | Status: DC | PRN
  Administered 2019-01-01: 180 mg via INTRAVENOUS

## 2019-01-01 MED ORDER — SCOPOLAMINE 1 MG/3DAYS TD PT72
MEDICATED_PATCH | TRANSDERMAL | Status: DC | PRN
  Administered 2019-01-01: 1 via TRANSDERMAL

## 2019-01-01 MED ORDER — LISINOPRIL 10 MG PO TABS
10.0000 mg | ORAL_TABLET | Freq: Every day | ORAL | Status: DC
  Administered 2019-01-01: 22:00:00 10 mg via ORAL
  Filled 2019-01-01: qty 1

## 2019-01-01 MED ORDER — ONDANSETRON HCL 4 MG/2ML IJ SOLN
4.0000 mg | Freq: Four times a day (QID) | INTRAMUSCULAR | Status: DC | PRN
  Administered 2019-01-01: 4 mg via INTRAVENOUS
  Filled 2019-01-01: qty 2

## 2019-01-01 MED ORDER — 0.9 % SODIUM CHLORIDE (POUR BTL) OPTIME
TOPICAL | Status: DC | PRN
  Administered 2019-01-01: 1000 mL

## 2019-01-01 MED ORDER — MIDAZOLAM HCL 5 MG/5ML IJ SOLN
INTRAMUSCULAR | Status: DC | PRN
  Administered 2019-01-01: 2 mg via INTRAVENOUS

## 2019-01-01 MED ORDER — FENTANYL CITRATE (PF) 100 MCG/2ML IJ SOLN
INTRAMUSCULAR | Status: DC | PRN
  Administered 2019-01-01: 100 ug via INTRAVENOUS
  Administered 2019-01-01 (×2): 50 ug via INTRAVENOUS

## 2019-01-01 MED ORDER — CHLORHEXIDINE GLUCONATE CLOTH 2 % EX PADS: 6.0000 | MEDICATED_PAD | Freq: Once | CUTANEOUS | Status: DC

## 2019-01-01 MED ORDER — HYDROMORPHONE HCL 1 MG/ML IJ SOLN
1.0000 mg | INTRAMUSCULAR | Status: DC | PRN
  Administered 2019-01-01: 11:00:00 1 mg via INTRAVENOUS
  Filled 2019-01-01: qty 1

## 2019-01-01 MED ORDER — MIDAZOLAM HCL 2 MG/2ML IJ SOLN
INTRAMUSCULAR | Status: AC
  Filled 2019-01-01: qty 2

## 2019-01-01 MED ORDER — KCL IN DEXTROSE-NACL 20-5-0.45 MEQ/L-%-% IV SOLN
INTRAVENOUS | Status: DC
  Administered 2019-01-01 – 2019-01-02 (×2): via INTRAVENOUS
  Filled 2019-01-01 (×2): qty 1000

## 2019-01-01 MED ORDER — HYDROCHLOROTHIAZIDE 12.5 MG PO CAPS: 12.5000 mg | ORAL_CAPSULE | Freq: Every day | ORAL | Status: DC

## 2019-01-01 MED ORDER — ONDANSETRON 4 MG PO TBDP: 4.0000 mg | ORAL_TABLET | Freq: Four times a day (QID) | ORAL | Status: DC | PRN

## 2019-01-01 MED ORDER — ALBUTEROL SULFATE (2.5 MG/3ML) 0.083% IN NEBU: 2.5000 mg | INHALATION_SOLUTION | Freq: Four times a day (QID) | RESPIRATORY_TRACT | Status: DC | PRN

## 2019-01-01 MED ORDER — TRAMADOL HCL 50 MG PO TABS: 50.0000 mg | ORAL_TABLET | Freq: Four times a day (QID) | ORAL | Status: DC | PRN

## 2019-01-01 MED ORDER — SCOPOLAMINE 1 MG/3DAYS TD PT72
MEDICATED_PATCH | TRANSDERMAL | Status: AC
  Filled 2019-01-01: qty 1

## 2019-01-01 MED ORDER — ACETAMINOPHEN 325 MG PO TABS: 650.0000 mg | ORAL_TABLET | Freq: Four times a day (QID) | ORAL | Status: DC | PRN

## 2019-01-01 MED ORDER — HYDROMORPHONE HCL 1 MG/ML IJ SOLN
INTRAMUSCULAR | Status: AC
  Filled 2019-01-01: qty 1

## 2019-01-01 SURGICAL SUPPLY — 31 items
ADH SKN CLS APL DERMABOND .7 (GAUZE/BANDAGES/DRESSINGS) ×1
APL PRP STRL LF DISP 70% ISPRP (MISCELLANEOUS) ×2
ATTRACTOMAT 16X20 MAGNETIC DRP (DRAPES) ×3 IMPLANT
BLADE SURG 15 STRL LF DISP TIS (BLADE) ×1 IMPLANT
BLADE SURG 15 STRL SS (BLADE) ×3
CHLORAPREP W/TINT 26 (MISCELLANEOUS) ×6 IMPLANT
CLIP VESOCCLUDE MED 6/CT (CLIP) ×6 IMPLANT
CLIP VESOCCLUDE SM WIDE 6/CT (CLIP) ×8 IMPLANT
COVER SURGICAL LIGHT HANDLE (MISCELLANEOUS) ×3 IMPLANT
COVER WAND RF STERILE (DRAPES) ×1 IMPLANT
DERMABOND ADVANCED (GAUZE/BANDAGES/DRESSINGS) ×2
DERMABOND ADVANCED .7 DNX12 (GAUZE/BANDAGES/DRESSINGS) IMPLANT
DRAPE LAPAROTOMY T 98X78 PEDS (DRAPES) ×3 IMPLANT
ELECT PENCIL ROCKER SW 15FT (MISCELLANEOUS) ×3 IMPLANT
ELECT REM PT RETURN 15FT ADLT (MISCELLANEOUS) ×3 IMPLANT
GAUZE 4X4 16PLY RFD (DISPOSABLE) ×3 IMPLANT
GLOVE SURG ORTHO 8.0 STRL STRW (GLOVE) ×3 IMPLANT
GOWN STRL REUS W/TWL XL LVL3 (GOWN DISPOSABLE) ×6 IMPLANT
HEMOSTAT SURGICEL 2X4 FIBR (HEMOSTASIS) ×2 IMPLANT
ILLUMINATOR WAVEGUIDE N/F (MISCELLANEOUS) ×2 IMPLANT
KIT BASIN OR (CUSTOM PROCEDURE TRAY) ×3 IMPLANT
KIT TURNOVER KIT A (KITS) IMPLANT
PACK BASIC VI WITH GOWN DISP (CUSTOM PROCEDURE TRAY) ×3 IMPLANT
SHEARS HARMONIC 9CM CVD (BLADE) ×3 IMPLANT
SUT MNCRL AB 4-0 PS2 18 (SUTURE) ×3 IMPLANT
SUT VIC AB 3-0 SH 18 (SUTURE) ×6 IMPLANT
SYR BULB IRRIGATION 50ML (SYRINGE) ×3 IMPLANT
TOWEL OR 17X26 10 PK STRL BLUE (TOWEL DISPOSABLE) ×3 IMPLANT
TOWEL OR NON WOVEN STRL DISP B (DISPOSABLE) ×3 IMPLANT
TUBING CONNECTING 10 (TUBING) ×2 IMPLANT
TUBING CONNECTING 10' (TUBING) ×1

## 2019-01-01 NOTE — Op Note (Signed)
Procedure Note  Pre-operative Diagnosis:  Right thyroid neoplasm of uncertain behavior  Post-operative Diagnosis:  same  Surgeon:  Armandina Gemma, MD  Assistant:  none   Procedure:  Right thyroid lobectomy  Anesthesia:  General  Estimated Blood Loss:  minimal  Drains: none         Specimen: thyroid lobe to pathology  Indications:  Patient is referred by Dr. Vivia Ewing for surgical evaluation and management of a dominant right thyroid nodule with cytologic atypia. Patient's primary care is provided by Candace Gallus, NP, in [place], [place]. Patient was found in the fall of 2019 to have a palpable thyroid nodule on physical examination. Patient subsequently underwent thyroid ultrasound at the [facility] in [place]. This demonstrated a 2.9 x 2.7 x 2.1 cm solid nodule in the mid right thyroid lobe. This was felt to be mildly suspicious and biopsy was recommended. On June 02, 2018 the patient underwent fine-needle aspiration biopsy. This showed cytologic atypia of undetermined significance. Patient was referred to endocrinology and evaluated. She is now referred to surgery for consideration for surgical resection for definitive diagnosis and management. Patient did not have molecular genetic testing. Patient has had no prior history of head or neck surgery. She has taken thyroid hormone in the past for polycystic ovarian syndrome. She has been off of that medication now for many years. There is no family history of thyroid disease. Patient does note some mild compressive symptoms with occasional dysphagia to both solids and liquids. She notes occasional voice quality changes. She is very aware of the cosmetic appearance with fullness in the right anterior neck. She presents today to discuss possible surgical removal.  Procedure Details: Procedure was done in OR #1 at the [facility]. The patient was brought to the operating room and placed in a supine  position on the operating room table. Following administration of general anesthesia, the patient was positioned and then prepped and draped in the usual aseptic fashion. After ascertaining that an adequate level of anesthesia had been achieved, a small Kocher incision was made with #15 blade. Dissection was carried through subcutaneous tissues and platysma. Hemostasis was achieved with the electrocautery. Skin flaps were elevated cephalad and caudad from the thyroid notch to the sternal notch. A self-retaining retractor was placed for exposure. Strap muscles were incised in the midline and dissection was begun on the right side. Strap muscles were reflected laterally. The right thyroid lobe was moderately enlarged, soft, with a dominant 2.5 - 3.0 cm nodule. The lobe was gently mobilized with blunt dissection. Superior pole vessels were dissected out and divided individually between small and medium ligaclips with the harmonic scalpel. The thyroid lobe was rolled anteriorly. Branches of the inferior thyroid artery were divided between small ligaclips with the harmonic scalpel. Inferior venous tributaries were divided between ligaclips. Both the superior and inferior parathyroid glands were identified and preserved on their vascular pedicles. The recurrent laryngeal nerve was identified and preserved along its course. The ligament of Gwenlyn Found was released with the electrocautery and the gland was mobilized onto the anterior trachea. Isthmus was mobilized across the midline. There was no pyramidal lobe present. The thyroid parenchyma was transected at the junction of the isthmus and contralateral thyroid lobe with the harmonic scalpel. The thyroid lobe and isthmus were submitted to pathology for review.  The neck was irrigated with warm saline. Fibrillar was placed throughout the operative field. Strap muscles were approximated in the midline with interrupted 3-0 Vicryl sutures. Platysma was closed  with interrupted 3-0  Vicryl sutures. Skin was closed with a running 4-0 Monocryl subcuticular suture.  Wound was washed and dried and Dermabond was applied. The patient was awakened from anesthesia and brought to the recovery room. The patient tolerated the procedure well.   Armandina Gemma, MD [facility] Office: [phone]

## 2019-01-01 NOTE — Transfer of Care (Signed)
Immediate Anesthesia Transfer of Care Note  Patient: Denise Evans  Procedure(s) Performed: Procedure(s): RIGHT THYROID LOBECTOMY (Right)  Patient Location: PACU  Anesthesia Type:General  Level of Consciousness:  sedated, patient cooperative and responds to stimulation  Airway & Oxygen Therapy:Patient Spontanous Breathing and Patient connected to face mask oxgen  Post-op Assessment:  Report given to PACU RN and Post -op Vital signs reviewed and stable  Post vital signs:  Reviewed and stable  Last Vitals:  Vitals:   01/01/19 0541  BP: 127/82  Pulse: 76  Resp: 14  Temp: 36.8 C  SpO2: 67%    Complications: No apparent anesthesia complications

## 2019-01-01 NOTE — Anesthesia Postprocedure Evaluation (Signed)
Anesthesia Post Note  Patient: Denise Evans  Procedure(s) Performed: RIGHT THYROID LOBECTOMY (Right )     Patient location during evaluation: PACU Anesthesia Type: General Level of consciousness: sedated Pain management: pain level controlled Vital Signs Assessment: post-procedure vital signs reviewed and stable Respiratory status: spontaneous breathing Cardiovascular status: stable Postop Assessment: no apparent nausea or vomiting Anesthetic complications: no    Last Vitals:  Vitals:   01/01/19 1305 01/01/19 1439  BP: (!) 156/101 131/69  Pulse: 69 70  Resp: 20 18  Temp: 36.8 C 36.9 C  SpO2: 100% 100%    Last Pain:  Vitals:   01/01/19 1439  TempSrc: Oral  PainSc:    Pain Goal: Patients Stated Pain Goal: 3 (01/01/19 1031)                 Huston Foley

## 2019-01-01 NOTE — Interval H&P Note (Signed)
History and Physical Interval Note:  01/01/2019 6:59 AM  Denise Evans  has presented today for surgery, with the diagnosis of THYROID NEOPLASM OF UNCERTAIN BEHAVIOR.  The various methods of treatment have been discussed with the patient and family. After consideration of risks, benefits and other options for treatment, the patient has consented to  Procedure(s): RIGHT THYROID LOBECTOMY (Right) as a surgical intervention.  The patient's history has been reviewed, patient examined, no change in status, stable for surgery.  I have reviewed the patient's chart and labs.  Questions were answered to the patient's satisfaction.     Armandina Gemma

## 2019-01-01 NOTE — Interval H&P Note (Signed)
History and Physical Interval Note:  01/01/2019 6:58 AM  Denise Evans  has presented today for surgery, with the diagnosis of THYROID NEOPLASM OF UNCERTAIN BEHAVIOR.  The various methods of treatment have been discussed with the patient and family. After consideration of risks, benefits and other options for treatment, the patient has consented to  Procedure(s): RIGHT THYROID LOBECTOMY (Right) as a surgical intervention.  The patient's history has been reviewed, patient examined, no change in status, stable for surgery.  I have reviewed the patient's chart and labs.  Questions were answered to the patient's satisfaction.     Armandina Gemma

## 2019-01-01 NOTE — Anesthesia Procedure Notes (Signed)
Procedure Name: Intubation Date/Time: 01/01/2019 7:30 AM Performed by: Lavina Hamman, CRNA Pre-anesthesia Checklist: Patient identified, Emergency Drugs available, Suction available, Patient being monitored and Timeout performed Patient Re-evaluated:Patient Re-evaluated prior to induction Oxygen Delivery Method: Circle system utilized Preoxygenation: Pre-oxygenation with 100% oxygen Induction Type: IV induction Ventilation: Mask ventilation without difficulty Laryngoscope Size: Mac and 3 Grade View: Grade I Tube type: Oral Tube size: 7.0 mm Number of attempts: 1 Airway Equipment and Method: Stylet Placement Confirmation: ETT inserted through vocal cords under direct vision,  positive ETCO2,  CO2 detector and breath sounds checked- equal and bilateral Secured at: 21 cm Tube secured with: Tape Dental Injury: Teeth and Oropharynx as per pre-operative assessment  Comments: Peak pressures 38 after intubation, albuterol given with good results. ATOI.

## 2019-01-02 ENCOUNTER — Encounter (HOSPITAL_COMMUNITY): Payer: Self-pay | Admitting: Surgery

## 2019-01-02 DIAGNOSIS — E039 Hypothyroidism, unspecified: Secondary | ICD-10-CM | POA: Diagnosis not present

## 2019-01-02 DIAGNOSIS — J45909 Unspecified asthma, uncomplicated: Secondary | ICD-10-CM | POA: Diagnosis not present

## 2019-01-02 DIAGNOSIS — Z888 Allergy status to other drugs, medicaments and biological substances status: Secondary | ICD-10-CM | POA: Diagnosis not present

## 2019-01-02 DIAGNOSIS — R1310 Dysphagia, unspecified: Secondary | ICD-10-CM | POA: Diagnosis not present

## 2019-01-02 DIAGNOSIS — E049 Nontoxic goiter, unspecified: Secondary | ICD-10-CM | POA: Diagnosis not present

## 2019-01-02 DIAGNOSIS — E041 Nontoxic single thyroid nodule: Secondary | ICD-10-CM | POA: Diagnosis not present

## 2019-01-02 DIAGNOSIS — D34 Benign neoplasm of thyroid gland: Secondary | ICD-10-CM | POA: Diagnosis not present

## 2019-01-02 DIAGNOSIS — Z79899 Other long term (current) drug therapy: Secondary | ICD-10-CM | POA: Diagnosis not present

## 2019-01-02 DIAGNOSIS — I10 Essential (primary) hypertension: Secondary | ICD-10-CM | POA: Diagnosis not present

## 2019-01-02 MED ORDER — HYDROCODONE-ACETAMINOPHEN 5-325 MG PO TABS: 1.0000 | ORAL_TABLET | ORAL | 0 refills | Status: DC | PRN

## 2019-01-02 NOTE — Progress Notes (Signed)
PIV removed without difficulty. Discharge instructions reviewed with pt and printed copy provided. Pt denies any questions or concerns. Pt discharged home with husband

## 2019-01-02 NOTE — Discharge Summary (Signed)
Physician Discharge Summary [facility]  Patient ID: Denise Evans MRN: [id number] DOB/AGE: 1975-11-02 43 y.o.  Admit date: 01/01/2019 Discharge date: 01/02/2019  Admission Diagnoses:  Right thyroid nodule  Discharge Diagnoses:  Principal Problem:   Neoplasm of uncertain behavior of thyroid gland Active Problems:   Thyroid nodule   Discharged Condition: good  Hospital Course: Patient was admitted for observation following thyroid surgery.  Post op course was uncomplicated.  Pain was well controlled.  Tolerated diet.  Patient was prepared for discharge home on POD#1.  Consults: None  Treatments: surgery: right thyroid lobectomy  Discharge Exam: Blood pressure 127/89, pulse (!) 58, temperature 98.5 F (36.9 C), temperature source Oral, resp. rate 18, height 5\' 5"  (1.651 m), weight 102.3 kg, SpO2 96 %. HEENT - clear Neck - wound dry and intact; Dermabond in place; minimal swelling; voice normal Chest - clear bilaterally Cor - RRR   Disposition: Home  Discharge Instructions    Diet - low sodium heart healthy   Complete by: As directed    Discharge instructions   Complete by: As directed    [facility]  THYROID & PARATHYROID SURGERY:  POST-OP INSTRUCTIONS  Always review your discharge instruction sheet from the facility where your surgery was performed.  A prescription for pain medication may be given to you upon discharge.  Take your pain medication as prescribed.  If narcotic pain medicine is not needed, then you may take acetaminophen (Tylenol) or ibuprofen (Advil) as needed.  Take your usually prescribed medications unless otherwise directed.  If you need a refill on your pain medication, please contact our office during regular business hours.  Prescriptions cannot be processed by our office after 5 pm or on weekends.  Start with a light diet upon arrival home, such as soup and crackers or toast.  Be sure to drink plenty  of fluids daily.  Resume your normal diet the day after surgery.  Most patients will experience some swelling and bruising on the chest and neck area.  Ice packs will help.  Swelling and bruising can take several days to resolve.   It is common to experience some constipation after surgery.  Increasing fluid intake and taking a stool softener (Colace) will usually help or prevent this problem.  A mild laxative (Milk of Magnesia or Miralax) should be taken according to package directions if there has been no bowel movement after 48 hours.  You have steri-strips and a gauze dressing over your incision.  You may remove the gauze bandage on the second day after surgery, and you may shower at that time.  Leave your steri-strips (small skin tapes) in place directly over the incision.  These strips should remain on the skin for 5-7 days and then be removed.  You may get them wet in the shower and pat them dry.  You may resume regular (light) daily activities beginning the next day (such as daily self-care, walking, climbing stairs) gradually increasing activities as tolerated.  You may have sexual intercourse when it is comfortable.  Refrain from any heavy lifting or straining until approved by your doctor.  You may drive when you no longer are taking prescription pain medication, you can comfortably wear a seatbelt, and you can safely maneuver your car and apply brakes.  You should see your doctor in the office for a follow-up appointment approximately three weeks after your surgery.  Make sure that you call for this appointment within a day or  two after you arrive home to insure a convenient appointment time.  WHEN TO CALL YOUR DOCTOR: -- Fever greater than 101.5 -- Inability to urinate -- Nausea and/or vomiting - persistent -- Extreme swelling or bruising -- Continued bleeding from incision -- Increased pain, redness, or drainage from the incision -- Difficulty swallowing or breathing -- Muscle  cramping or spasms -- Numbness or tingling in hands or around lips  The clinic staff is available to answer your questions during regular business hours.  Please don't hesitate to call and ask to speak to one of the nurses if you have concerns.  Armandina Gemma, MD [facility] Office: [phone]   Increase activity slowly   Complete by: As directed    No dressing needed   Complete by: As directed      Allergies as of 01/02/2019      Reactions   Norethindrone Other (See Comments)   Liver enzymes off      Medication List    TAKE these medications   albuterol 108 (90 Base) MCG/ACT inhaler Commonly known as: VENTOLIN HFA Inhale 1-2 puffs into the lungs every 6 (six) hours as needed for wheezing or shortness of breath.   hydrochlorothiazide 12.5 MG capsule Commonly known as: MICROZIDE Take 12.5 mg by mouth daily.   HYDROcodone-acetaminophen 5-325 MG tablet Commonly known as: NORCO/VICODIN Take 1-2 tablets by mouth every 4 (four) hours as needed for moderate pain.   lisinopril 10 MG tablet Commonly known as: ZESTRIL Take 10 mg by mouth daily.      Follow-up Information    Armandina Gemma, MD. Schedule an appointment as soon as possible for a visit in 3 week(s).   Specialty: General Surgery Contact information: [address] [place] [address] [phone]           Earnstine Regal, MD, [facility] Office: [phone]   Signed: Armandina Gemma 01/02/2019, 8:02 AM

## 2019-01-04 ENCOUNTER — Telehealth: Payer: Self-pay | Admitting: Internal Medicine

## 2019-01-04 NOTE — Telephone Encounter (Signed)
-----   Message from Cloyd Stagers, MD sent at 01/01/2019  2:32 PM EDT ----- Please set her up to see me in 6 weeks for post-surgical hypothyroidism   Please notify the pt    Thanks

## 2019-01-07 NOTE — Progress Notes (Signed)
Please contact patient and notify of benign pathology results.  Lucresia Simic M. Marisabel Macpherson, MD, FACS [facility] Office: [phone]   

## 2019-02-01 ENCOUNTER — Other Ambulatory Visit: Payer: Self-pay

## 2019-02-01 ENCOUNTER — Ambulatory Visit (HOSPITAL_COMMUNITY)
Admission: EM | Admit: 2019-02-01 | Discharge: 2019-02-01 | Disposition: A | Discharge status: Home / Self Care | Payer: BC Managed Care – PPO | Attending: Internal Medicine | Admitting: Internal Medicine

## 2019-02-01 DIAGNOSIS — Z20828 Contact with and (suspected) exposure to other viral communicable diseases: Secondary | ICD-10-CM | POA: Diagnosis not present

## 2019-02-01 DIAGNOSIS — Z20822 Contact with and (suspected) exposure to covid-19: Secondary | ICD-10-CM

## 2019-02-01 NOTE — ED Provider Notes (Signed)
[place]    CSN: [id number] Arrival date & time: 02/01/19  1324      History   Chief Complaint Chief Complaint  Patient presents with  . covid testing    HPI Denise Evans is a 43 y.o. female with history of asthma controlled with an albuterol inhaler, hypertension on hydrochlorothiazide comes to urgent care with concerns for COVID-19.  Patient had exposure to somebody diagnosed with COVID-19.  Patient denies any shortness of breath, cough, wheezing, fever or chills.   No change in taste or smelling.  HPI  Past Medical History:  Diagnosis Date  . Anemia   . Asthma   . Complication of anesthesia    Scarring of the esophagus  . Hypertension   . Hypothyroidism     Patient Active Problem List   Diagnosis Date Noted  . Neoplasm of uncertain behavior of thyroid gland 11/18/2018  . S/P fine needle biopsy 08/26/2018  . Thyroid nodule 08/26/2018  . Primary osteoarthritis of both knees 10/20/2015  . Pes planus 10/12/2015  . Accessory skin tags 10/12/2015    Past Surgical History:  Procedure Laterality Date  . D & C    . hystrectomy    . THYROID LOBECTOMY Right 01/01/2019   Procedure: RIGHT THYROID LOBECTOMY;  Surgeon: Armandina Gemma, MD;  Location: [facility];  Service: General;  Laterality: Right;    OB History   No obstetric history on file.      Home Medications    Prior to Admission medications   Medication Sig Start Date End Date Taking? Authorizing Provider  albuterol (VENTOLIN HFA) 108 (90 Base) MCG/ACT inhaler Inhale 1-2 puffs into the lungs every 6 (six) hours as needed for wheezing or shortness of breath.    [provider]  hydrochlorothiazide (MICROZIDE) 12.5 MG capsule Take 12.5 mg by mouth daily.    [provider]  HYDROcodone-acetaminophen (NORCO/VICODIN) 5-325 MG tablet Take 1-2 tablets by mouth every 4 (four) hours as needed for moderate pain. Patient not taking: Reported on 02/01/2019 01/02/19   Armandina Gemma, MD   lisinopril (ZESTRIL) 10 MG tablet Take 10 mg by mouth daily.    [provider]    Family History Family History  Problem Relation Age of Onset  . Uterine cancer Mother   . Diabetes Father   . Hypertension Father     Social History Social History   Tobacco Use  . Smoking status: Never Smoker  . Smokeless tobacco: Never Used  Substance Use Topics  . Alcohol use: Yes    Comment: occas  . Drug use: Never     Allergies   Norethindrone   Review of Systems Review of Systems  Constitutional: Negative.   HENT: Negative.   Respiratory: Negative.   Cardiovascular: Negative.   Gastrointestinal: Negative.   Genitourinary: Negative.      Physical Exam Triage Vital Signs ED Triage Vitals [02/01/19 1422]  Enc Vitals Group     BP (!) 145/90     Pulse Rate 60     Resp 18     Temp 98 F (36.7 C)     Temp Source Temporal     SpO2 99 %     Weight      Height      Head Circumference      Peak Flow      Pain Score 0     Pain Loc      Pain Edu?      Excl. in [place]?  No data found.  Updated Vital Signs BP (!) 145/90 (BP Location: Right Arm)   Pulse 60   Temp 98 F (36.7 C) (Temporal)   Resp 18   SpO2 99%   Visual Acuity Right Eye Distance:   Left Eye Distance:   Bilateral Distance:    Right Eye Near:   Left Eye Near:    Bilateral Near:     Physical Exam Constitutional:      Appearance: She is not ill-appearing.  Cardiovascular:     Rate and Rhythm: Normal rate and regular rhythm.     Pulses: Normal pulses.     Heart sounds: Normal heart sounds.  Pulmonary:     Effort: Pulmonary effort is normal.     Breath sounds: Normal breath sounds.  Abdominal:     General: Bowel sounds are normal.     Palpations: Abdomen is soft.  Musculoskeletal: Normal range of motion.  Skin:    General: Skin is warm.     Capillary Refill: Capillary refill takes less than 2 seconds.  Neurological:     Mental Status: She is alert.      UC Treatments / Results   Labs (all labs ordered are listed, but only abnormal results are displayed) Labs Reviewed - No data to display  EKG   Radiology No results found.  Procedures Procedures (including critical care time)  Medications Ordered in UC Medications - No data to display  Initial Impression / Assessment and Plan / UC Course  I have reviewed the triage vital signs and the nursing notes.  Pertinent labs & imaging results that were available during my care of the patient were reviewed by me and considered in my medical decision making (see chart for details).     1.  Close exposure to COVID-19 patient: COVID-19 testing done Patient currently has no symptoms She is advised to self isolate until the results of COVID-19 is available Return to urgent care for reevaluation if patient has any symptoms. Final Clinical Impressions(s) / UC Diagnoses   Final diagnoses:  None   Discharge Instructions   None    ED Prescriptions    None     Controlled Substance Prescriptions [place] Controlled Substance Registry consulted? No   Chase Picket, MD 02/03/19 1454

## 2019-02-01 NOTE — ED Triage Notes (Signed)
Pt here for covid testing after sts she was exposed to person who tested positive; pt denies sx

## 2019-02-03 LAB — NOVEL CORONAVIRUS, NAA (HOSP ORDER, SEND-OUT TO REF LAB; TAT 18-24 HRS): SARS-CoV-2, NAA: NOT DETECTED

## 2019-02-05 ENCOUNTER — Encounter (HOSPITAL_COMMUNITY): Payer: Self-pay

## 2019-03-05 ENCOUNTER — Other Ambulatory Visit: Payer: Self-pay | Admitting: Internal Medicine

## 2019-03-05 DIAGNOSIS — E041 Nontoxic single thyroid nodule: Secondary | ICD-10-CM

## 2019-03-08 ENCOUNTER — Other Ambulatory Visit: Payer: Self-pay

## 2019-03-08 ENCOUNTER — Other Ambulatory Visit (INDEPENDENT_AMBULATORY_CARE_PROVIDER_SITE_OTHER): Payer: BC Managed Care – PPO

## 2019-03-08 DIAGNOSIS — E041 Nontoxic single thyroid nodule: Secondary | ICD-10-CM

## 2019-03-08 LAB — BASIC METABOLIC PANEL
BUN: 11 mg/dL (ref 6–23)
CO2: 26 mEq/L (ref 19–32)
Calcium: 8.9 mg/dL (ref 8.4–10.5)
Chloride: 104 mEq/L (ref 96–112)
Creatinine, Ser: 1.01 mg/dL (ref 0.40–1.20)
GFR: 72.21 mL/min (ref >60.00)
Glucose, Bld: 96 mg/dL (ref 70–99)
Potassium: 4.1 mEq/L (ref 3.5–5.1)
Sodium: 137 mEq/L (ref 135–145)

## 2019-03-08 LAB — TSH: TSH: 25.71 u[IU]/mL — ABNORMAL HIGH (ref 0.35–4.50)

## 2019-03-08 LAB — ALBUMIN: Albumin: 3.7 g/dL (ref 3.5–5.2)

## 2019-03-08 LAB — T4, FREE: Free T4: 0.66 ng/dL (ref 0.60–1.60)

## 2019-03-11 ENCOUNTER — Ambulatory Visit (INDEPENDENT_AMBULATORY_CARE_PROVIDER_SITE_OTHER): Payer: BC Managed Care – PPO | Admitting: Internal Medicine

## 2019-03-11 ENCOUNTER — Other Ambulatory Visit: Payer: Self-pay

## 2019-03-11 ENCOUNTER — Encounter: Payer: Self-pay | Admitting: Internal Medicine

## 2019-03-11 VITALS — BP 118/84 | HR 70 | Temp 98.9°F | Ht 66.0 in | Wt 231.0 lb

## 2019-03-11 DIAGNOSIS — E890 Postprocedural hypothyroidism: Secondary | ICD-10-CM

## 2019-03-11 MED ORDER — LEVOTHYROXINE SODIUM 50 MCG PO TABS: 50.0000 ug | ORAL_TABLET | Freq: Every day | ORAL | 3 refills | Status: DC

## 2019-03-11 NOTE — Patient Instructions (Signed)

## 2019-03-11 NOTE — Progress Notes (Signed)
Name: Denise Evans  MRN/ DOB: [id number], Feb 03, 1976    Age/ Sex: 43 y.o., female     PCP: Assunta Curtis, [place]   Reason for Endocrinology Evaluation: Thyroid Nodule      Initial Endocrinology Clinic Visit: 08/25/2018    PATIENT IDENTIFIER: Denise Evans is a 43 y.o., female with a past medical history of Asthma, and HTN . She has followed with [facility] since 08/25/2018 for consultative assistance with management of her  Thyroid Nodule.  HISTORICAL SUMMARY: The patient was noted to have local neck enlargement on routine exam, this prompted a thyroid ultrasound which was performed on November,8th 2019, and revealed thyroid nodule requiring biopsy, images and report not available at this time. She did have an ultrasound guided FNA on 06/01/2018 of the right inferior thyroid nodule which is hypoechoic measuring 3.0 x 2.6 x 1.8 cm .  Cytology report consistent with atypia of undetermined significance (Bethesda category III), no molecular testing available.    S/P right lobectomy 01/01/2019  SUBJECTIVE:   Today (03/11/2019):  Denise Evans is here for a 6 weeks follow up Post-OP  Following a right thyroid lobectomy.  Her post-op course was uncomplicated She has sensitivity around the incision  She has noted weight gain  Has cold intolerance which is chronic She has chronic constipation  Denies depression  No recent fever.    ROS:  As per HPI.   HISTORY:  Past Medical History:  Past Medical History:  Diagnosis Date  . Anemia   . Asthma   . Complication of anesthesia    Scarring of the esophagus  . Hypertension   . Hypothyroidism    Past Surgical History:  Past Surgical History:  Procedure Laterality Date  . D & C    . hystrectomy    . THYROID LOBECTOMY Right 01/01/2019   Procedure: RIGHT THYROID LOBECTOMY;  Surgeon: Armandina Gemma, MD;  Location: [facility];  Service: General;  Laterality: Right;    Social History:  reports that she has never smoked. She  has never used smokeless tobacco. She reports current alcohol use. She reports that she does not use drugs. Family History:  Family History  Problem Relation Age of Onset  . Uterine cancer Mother   . Diabetes Father   . Hypertension Father      HOME MEDICATIONS: Allergies as of 03/11/2019      Reactions   Norethindrone Other (See Comments)   Liver enzymes off      Medication List       Accurate as of March 11, 2019  3:07 PM. If you have any questions, ask your nurse or doctor.        albuterol 108 (90 Base) MCG/ACT inhaler Commonly known as: VENTOLIN HFA Inhale 1-2 puffs into the lungs every 6 (six) hours as needed for wheezing or shortness of breath.   hydrochlorothiazide 12.5 MG capsule Commonly known as: MICROZIDE Take 12.5 mg by mouth daily.   levothyroxine 50 MCG tablet Commonly known as: SYNTHROID Take 1 tablet (50 mcg total) by mouth daily. Started by: Dorita Sciara, MD   lisinopril 10 MG tablet Commonly known as: ZESTRIL Take 10 mg by mouth daily.      PHYSICAL EXAM: VS: BP 118/84 (BP Location: Right Arm, Patient Position: Sitting, Cuff Size: Normal)   Pulse 70   Temp 98.9 F (37.2 C)   Ht 5\' 6"  (1.676 m)   Wt 231 lb (104.8 kg)   SpO2 98%   BMI  37.28 kg/m    EXAM: General: Pt appears well and is in NAD  Neck: General: Supple without adenopathy. Thyroid: Thyroid size normal.  No goiter or nodules appreciated. No thyroid bruit.  Lungs: Clear with good BS bilat with no rales, rhonchi, or wheezes  Heart: Auscultation: RRR with normal S1 and S2, no gallops or murmurs  Abdomen: Normoactive bowel sounds, soft, nontender, without masses or organomegaly palpable  Lymphatics:   Extremities:  BL LE: no pretibial edema normal ROM and strength, no joint enlargement or tenderness  Skin: Hair: texture and amount normal with gender appropriate distribution Skin Inspection: no rashes Skin Palpation: skin temperature, texture, and thickness normal to  palpation  Neuro: Cranial nerves: II - XII grossly intact ;  Motor: normal strength throughout DTRs: 2+ and symmetric in UE without delay in relaxation phase  Mental Status: Judgment, insight: intact Orientation: oriented to time, place, and person Mood and affect: no depression, anxiety, or agitation      DATA REVIEWED: Results for SPARKLE, GABEL (MRN [id number]) as of 03/11/2019 07:31  Ref. Range 03/08/2019 09:44  Sodium Latest Ref Range: 135 - 145 mEq/L 137  Potassium Latest Ref Range: 3.5 - 5.1 mEq/L 4.1  Chloride Latest Ref Range: 96 - 112 mEq/L 104  CO2 Latest Ref Range: 19 - 32 mEq/L 26  Glucose Latest Ref Range: 70 - 99 mg/dL 96  BUN Latest Ref Range: 6 - 23 mg/dL 11  Creatinine Latest Ref Range: 0.40 - 1.20 mg/dL 1.01  Calcium Latest Ref Range: 8.4 - 10.5 mg/dL 8.9  Albumin Latest Ref Range: 3.5 - 5.2 g/dL 3.7  GFR Latest Ref Range: >60.00 mL/min 72.21  TSH Latest Ref Range: 0.35 - 4.50 uIU/mL 25.71 (H)  T4,Free(Direct) Latest Ref Range: 0.60 - 1.60 ng/dL 0.66   Thyroid Pathology 01/01/2019  Thyroid, lobectomy, right thyroid lobe - THYROID GLAND WITH NODULAR HYPERPLASIA - ADENOMA WITH HURTHLE CELL FEATURES (0.5 CM) - BENIGN PARATHYROID   ASSESSMENT / PLAN / RECOMMENDATIONS:   1. Post-Operative Hypothyroidism:  - Clinically and biochemically hypothyroid - Pt educated extensively on the correct way to take levothyroxine (first thing in the morning with water, 30 minutes before eating or taking other medications). - Pt encouraged to double dose the following day if she were to miss a dose given long half-life of levothyroxine.  Medications: Levothyroxine 50 mcg daily   1. Right Thyroid Nodule , S/P FNA with AUS, S/P Right Lobectomy   - Pt had FNA of the right thyroid nodule with cytology report of Atypia, Molecular testing was unavailable. Pt opted for right lobectomy.  - S/P Right lobectomy 01/01/2019 with benign pathology report.  - Calcium is normal    F/u  4 months     Signed electronically by: Mack Guise, MD  [facility]  [facility] [address] [place], [place] [address] Phone: [phone] FAX: [phone]   CC: Assunta Curtis, [id number] [place] [address] Phone: [phone] Fax: [phone]   Return to Endocrinology clinic as below: Future Appointments  Date Time Provider [place]  05/04/2019  8:00 AM [facility] None  07/12/2019  7:30 AM Kymoni Monday, Melanie Crazier, MD [facility] None

## 2019-04-27 ENCOUNTER — Other Ambulatory Visit: Payer: Self-pay | Admitting: Internal Medicine

## 2019-04-27 DIAGNOSIS — E890 Postprocedural hypothyroidism: Secondary | ICD-10-CM

## 2019-05-04 ENCOUNTER — Other Ambulatory Visit: Payer: Self-pay

## 2019-05-04 ENCOUNTER — Other Ambulatory Visit (INDEPENDENT_AMBULATORY_CARE_PROVIDER_SITE_OTHER): Payer: BC Managed Care – PPO

## 2019-05-04 DIAGNOSIS — E890 Postprocedural hypothyroidism: Secondary | ICD-10-CM

## 2019-05-04 LAB — TSH: TSH: 14.88 u[IU]/mL — ABNORMAL HIGH (ref 0.35–4.50)

## 2019-05-04 LAB — T4, FREE: Free T4: 0.68 ng/dL (ref 0.60–1.60)

## 2019-05-04 MED ORDER — LEVOTHYROXINE SODIUM 88 MCG PO TABS: 88.0000 ug | ORAL_TABLET | Freq: Every day | ORAL | 3 refills | Status: DC

## 2019-05-04 NOTE — Progress Notes (Signed)
TSh still elevated, will increase levothyroxine from 50 to 88 mcg daily

## 2019-06-29 DIAGNOSIS — Z20828 Contact with and (suspected) exposure to other viral communicable diseases: Secondary | ICD-10-CM | POA: Diagnosis not present

## 2019-07-12 ENCOUNTER — Ambulatory Visit: Payer: BC Managed Care – PPO | Admitting: Internal Medicine

## 2019-07-12 ENCOUNTER — Other Ambulatory Visit: Payer: Self-pay

## 2019-07-12 NOTE — Progress Notes (Deleted)
Name: Denise Evans  MRN/ DOB: [id number], 02-21-1976    Age/ Sex: 44 y.o., female     PCP: Assunta Curtis, [place]   Reason for Endocrinology Evaluation: Thyroid Nodule      Initial Endocrinology Clinic Visit: 08/25/2018    PATIENT IDENTIFIER: Denise Evans is a 44 y.o., female with a past medical history of Asthma, and HTN . She has followed with [facility] since 08/25/2018 for consultative assistance with management of her  Thyroid Nodule.  HISTORICAL SUMMARY: The patient was noted to have local neck enlargement on routine exam, this prompted a thyroid ultrasound which was performed on November,8th 2019, and revealed thyroid nodule requiring biopsy, images and report not available at this time. She did have an ultrasound guided FNA on 06/01/2018 of the right inferior thyroid nodule which is hypoechoic measuring 3.0 x 2.6 x 1.8 cm .  Cytology report consistent with atypia of undetermined significance (Bethesda category III), no molecular testing available.    S/P right lobectomy 01/01/2019  SUBJECTIVE:   During last visit (03/10/2020): TSH 25.71 uIU/mL . Started levothyroxine 50 mcg.     Today (07/12/2019):  Denise Evans is here for a 3 month follow up . She is S/P  right thyroid lobectomy.   Has cold intolerance which is chronic She has chronic constipation  Denies depression  No recent fever.    ROS:  As per HPI.   HISTORY:  Past Medical History:  Past Medical History:  Diagnosis Date  . Anemia   . Asthma   . Complication of anesthesia    Scarring of the esophagus  . Hypertension   . Hypothyroidism    Past Surgical History:  Past Surgical History:  Procedure Laterality Date  . D & C    . hystrectomy    . THYROID LOBECTOMY Right 01/01/2019   Procedure: RIGHT THYROID LOBECTOMY;  Surgeon: Armandina Gemma, MD;  Location: [facility];  Service: General;  Laterality: Right;    Social History:  reports that she has never smoked. She has never used smokeless  tobacco. She reports current alcohol use. She reports that she does not use drugs. Family History:  Family History  Problem Relation Age of Onset  . Uterine cancer Mother   . Diabetes Father   . Hypertension Father      HOME MEDICATIONS: Allergies as of 07/12/2019      Reactions   Norethindrone Other (See Comments)   Liver enzymes off      Medication List       Accurate as of July 12, 2019  7:14 AM. If you have any questions, ask your nurse or doctor.        albuterol 108 (90 Base) MCG/ACT inhaler Commonly known as: VENTOLIN HFA Inhale 1-2 puffs into the lungs every 6 (six) hours as needed for wheezing or shortness of breath.   hydrochlorothiazide 12.5 MG capsule Commonly known as: MICROZIDE Take 12.5 mg by mouth daily.   levothyroxine 88 MCG tablet Commonly known as: SYNTHROID Take 1 tablet (88 mcg total) by mouth daily.   lisinopril 10 MG tablet Commonly known as: ZESTRIL Take 10 mg by mouth daily.      PHYSICAL EXAM: VS: There were no vitals taken for this visit.   EXAM: General: Pt appears well and is in NAD  Neck: General: Supple without adenopathy. Thyroid: Thyroid size normal.  No goiter or nodules appreciated. No thyroid bruit.  Lungs: Clear with good BS bilat with no rales, rhonchi, or  wheezes  Heart: Auscultation: RRR with normal S1 and S2, no gallops or murmurs  Abdomen: Normoactive bowel sounds, soft, nontender, without masses or organomegaly palpable  Lymphatics:   Extremities:  BL LE: no pretibial edema normal ROM and strength, no joint enlargement or tenderness  Skin: Hair: texture and amount normal with gender appropriate distribution Skin Inspection: no rashes Skin Palpation: skin temperature, texture, and thickness normal to palpation  Neuro: Cranial nerves: II - XII grossly intact ;  Motor: normal strength throughout DTRs: 2+ and symmetric in UE without delay in relaxation phase  Mental Status: Judgment, insight: intact Orientation:  oriented to time, place, and person Mood and affect: no depression, anxiety, or agitation      DATA REVIEWED: Results for Denise Evans, Denise Evans (MRN [id number]) as of 03/11/2019 07:31  Ref. Range 03/08/2019 09:44  Sodium Latest Ref Range: 135 - 145 mEq/L 137  Potassium Latest Ref Range: 3.5 - 5.1 mEq/L 4.1  Chloride Latest Ref Range: 96 - 112 mEq/L 104  CO2 Latest Ref Range: 19 - 32 mEq/L 26  Glucose Latest Ref Range: 70 - 99 mg/dL 96  BUN Latest Ref Range: 6 - 23 mg/dL 11  Creatinine Latest Ref Range: 0.40 - 1.20 mg/dL 1.01  Calcium Latest Ref Range: 8.4 - 10.5 mg/dL 8.9  Albumin Latest Ref Range: 3.5 - 5.2 g/dL 3.7  GFR Latest Ref Range: >60.00 mL/min 72.21  TSH Latest Ref Range: 0.35 - 4.50 uIU/mL 25.71 (H)  T4,Free(Direct) Latest Ref Range: 0.60 - 1.60 ng/dL 0.66   Thyroid Pathology 01/01/2019  Thyroid, lobectomy, right thyroid lobe - THYROID GLAND WITH NODULAR HYPERPLASIA - ADENOMA WITH HURTHLE CELL FEATURES (0.5 CM) - BENIGN PARATHYROID   ASSESSMENT / PLAN / RECOMMENDATIONS:   1. Post-Operative Hypothyroidism:  - Clinically and biochemically hypothyroid - Pt educated extensively on the correct way to take levothyroxine (first thing in the morning with water, 30 minutes before eating or taking other medications). - Pt encouraged to double dose the following day if she were to miss a dose given long half-life of levothyroxine.  Medications: Levothyroxine 50 mcg daily     F/u 4 months     Signed electronically by: Mack Guise, MD  [facility]  [facility] [address] [place], [place] [address] Phone: [phone] FAX: [phone]   CC: Assunta Curtis, [id number] [place] [address] Phone: [phone] Fax: [phone]   Return to Endocrinology clinic as below: Future Appointments  Date Time Provider [place]  07/12/2019  7:30 AM Enes Rokosz, Melanie Crazier, MD [facility]  None

## 2019-07-26 ENCOUNTER — Ambulatory Visit (INDEPENDENT_AMBULATORY_CARE_PROVIDER_SITE_OTHER): Payer: BC Managed Care – PPO | Admitting: Internal Medicine

## 2019-07-26 ENCOUNTER — Encounter: Payer: Self-pay | Admitting: Internal Medicine

## 2019-07-26 ENCOUNTER — Other Ambulatory Visit: Payer: Self-pay

## 2019-07-26 VITALS — BP 120/78 | HR 78 | Temp 98.2°F | Ht 66.0 in | Wt 237.4 lb

## 2019-07-26 DIAGNOSIS — E890 Postprocedural hypothyroidism: Secondary | ICD-10-CM | POA: Diagnosis not present

## 2019-07-26 LAB — T4, FREE: Free T4: 0.89 ng/dL (ref 0.60–1.60)

## 2019-07-26 LAB — TSH: TSH: 25.91 u[IU]/mL — ABNORMAL HIGH (ref 0.35–4.50)

## 2019-07-26 NOTE — Progress Notes (Signed)
Name: Denise Evans  MRN/ DOB: [id number], 1975/07/10    Age/ Sex: 44 y.o., female     PCP: Assunta Curtis, [place]   Reason for Endocrinology Evaluation: Thyroid Nodule      Initial Endocrinology Clinic Visit: 08/25/2018    PATIENT IDENTIFIER: Denise Evans is a 44 y.o., female with a past medical history of Asthma, and HTN . She has followed with [facility] since 08/25/2018 for consultative assistance with management of her  Thyroid Nodule.  HISTORICAL SUMMARY: The patient was noted to have local neck enlargement on routine exam, this prompted a thyroid ultrasound which was performed on November,8th 2019, and revealed thyroid nodule requiring biopsy, images and report not available at this time. She did have an ultrasound guided FNA on 06/01/2018 of the right inferior thyroid nodule which is hypoechoic measuring 3.0 x 2.6 x 1.8 cm .  Cytology report consistent with atypia of undetermined significance (Bethesda category III), no molecular testing available.    S/P right lobectomy 01/01/2019  Levothyroxine started 02/2019  SUBJECTIVE:   During last visit (03/11/2019): TSH elevated, we increased LT-4 replacement dose.    Today (07/26/2019):  Denise Evans is here for a 4 month follow up . She is S/P  right thyroid lobectomy in 12/2018. She admits to imperfect adherence . She continued to have chronic cold intolerance Continues with chronic constipation  Denies depression or anxiety  Recently recovered from [place].    ROS:  As per HPI.   HISTORY:  Past Medical History:  Past Medical History:  Diagnosis Date  . Anemia   . Asthma   . Complication of anesthesia    Scarring of the esophagus  . Hypertension   . Hypothyroidism    Past Surgical History:  Past Surgical History:  Procedure Laterality Date  . D & C    . hystrectomy    . THYROID LOBECTOMY Right 01/01/2019   Procedure: RIGHT THYROID LOBECTOMY;  Surgeon: Armandina Gemma, MD;  Location: [facility];  Service:  General;  Laterality: Right;    Social History:  reports that she has never smoked. She has never used smokeless tobacco. She reports current alcohol use. She reports that she does not use drugs. Family History:  Family History  Problem Relation Age of Onset  . Uterine cancer Mother   . Diabetes Father   . Hypertension Father      HOME MEDICATIONS: Allergies as of 07/26/2019      Reactions   Norethindrone Other (See Comments)   Liver enzymes off      Medication List       Accurate as of July 26, 2019  8:11 AM. If you have any questions, ask your nurse or doctor.        albuterol 108 (90 Base) MCG/ACT inhaler Commonly known as: VENTOLIN HFA Inhale 1-2 puffs into the lungs every 6 (six) hours as needed for wheezing or shortness of breath.   hydrochlorothiazide 12.5 MG capsule Commonly known as: MICROZIDE Take 12.5 mg by mouth daily.   levothyroxine 88 MCG tablet Commonly known as: SYNTHROID Take 1 tablet (88 mcg total) by mouth daily.   lisinopril 10 MG tablet Commonly known as: ZESTRIL Take 10 mg by mouth daily.   phentermine 37.5 MG tablet Commonly known as: ADIPEX-P Take 37.5 mg by mouth daily.      PHYSICAL EXAM: VS: BP 120/78   Pulse 78   Temp 98.2 F (36.8 C) (Oral)   Ht 5\' 6"  (1.676 m)  Wt 237 lb 6.4 oz (107.7 kg)   SpO2 97%   BMI 38.32 kg/m    EXAM: General: Pt appears well and is in NAD  Neck: General: Supple without adenopathy. Thyroid: Thyroid size is normal on left.   Lungs: Clear with good BS bilat with no rales, rhonchi, or wheezes  Heart: Auscultation: RRR with normal S1 and S2, no gallops or murmurs  Abdomen: Normoactive bowel sounds, soft, nontender, without masses or organomegaly palpable  Lymphatics:   Extremities:  BL LE: no pretibial edema  Mental Status: Judgment, insight: intact Orientation: oriented to time, place, and person Mood and affect: no depression, anxiety, or agitation      DATA REVIEWED:  Results for  Denise, Evans (MRN [id number]) as of 07/27/2019 08:00  Ref. Range 07/26/2019 08:04  TSH Latest Ref Range: 0.35 - 4.50 uIU/mL 25.91 (H)  T4,Free(Direct) Latest Ref Range: 0.60 - 1.60 ng/dL 0.89    Thyroid Pathology 01/01/2019  Thyroid, lobectomy, right thyroid lobe - THYROID GLAND WITH NODULAR HYPERPLASIA - ADENOMA WITH HURTHLE CELL FEATURES (0.5 CM) - BENIGN PARATHYROID   ASSESSMENT / PLAN / RECOMMENDATIONS:   1. Post-Operative Hypothyroidism:  - Clinically is euthyroid  - Pt educated extensively on the correct way to take levothyroxine (first thing in the morning with water, 30 minutes before eating or taking other medications). - Pt encouraged to double dose the following day if she were to miss a dose given long half-life of levothyroxine.  Medications: Stop Levothyroxine 88 mcg  Start Levothyroxine 100 mcg daily     F/u 6 months     Signed electronically by: Mack Guise, MD  [facility]  [facility] [address] [place], [place] [address] Phone: [phone] FAX: [phone]   CC: Assunta Curtis, [id number] [place] [address] Phone: [phone] Fax: [phone]   Return to Endocrinology clinic as below: Future Appointments  Date Time Provider [place]  01/24/2020  7:30 AM , Melanie Crazier, MD [facility] None

## 2019-07-26 NOTE — Patient Instructions (Addendum)
You are on levothyroxine - which is your thyroid hormone supplement. You MUST take this consistently.  You should take this first thing in the morning on an empty stomach with water. You should not take it with other medications. Wait 36min to 1hr prior to eating. If you are taking any vitamins - please take these in the evening.   If you miss a dose, please take your missed dose the following day (double the dose for that day). You should have a pill box for ONLY levothyroxine on your bedside table to help you remember to take your medications. Concepcion

## 2019-07-27 ENCOUNTER — Encounter: Payer: Self-pay | Admitting: Internal Medicine

## 2019-07-27 MED ORDER — LEVOTHYROXINE SODIUM 100 MCG PO TABS: 100.0000 ug | ORAL_TABLET | Freq: Every day | ORAL | 3 refills | Status: DC

## 2019-09-10 IMAGING — CR CHEST - 2 VIEW
2 series · 2 of 2 positions shown · non-contrast
Comparison: None.

CLINICAL DATA: Preop thyroid surgery.

EXAM:
CHEST - 2 VIEW

[w chest pa]
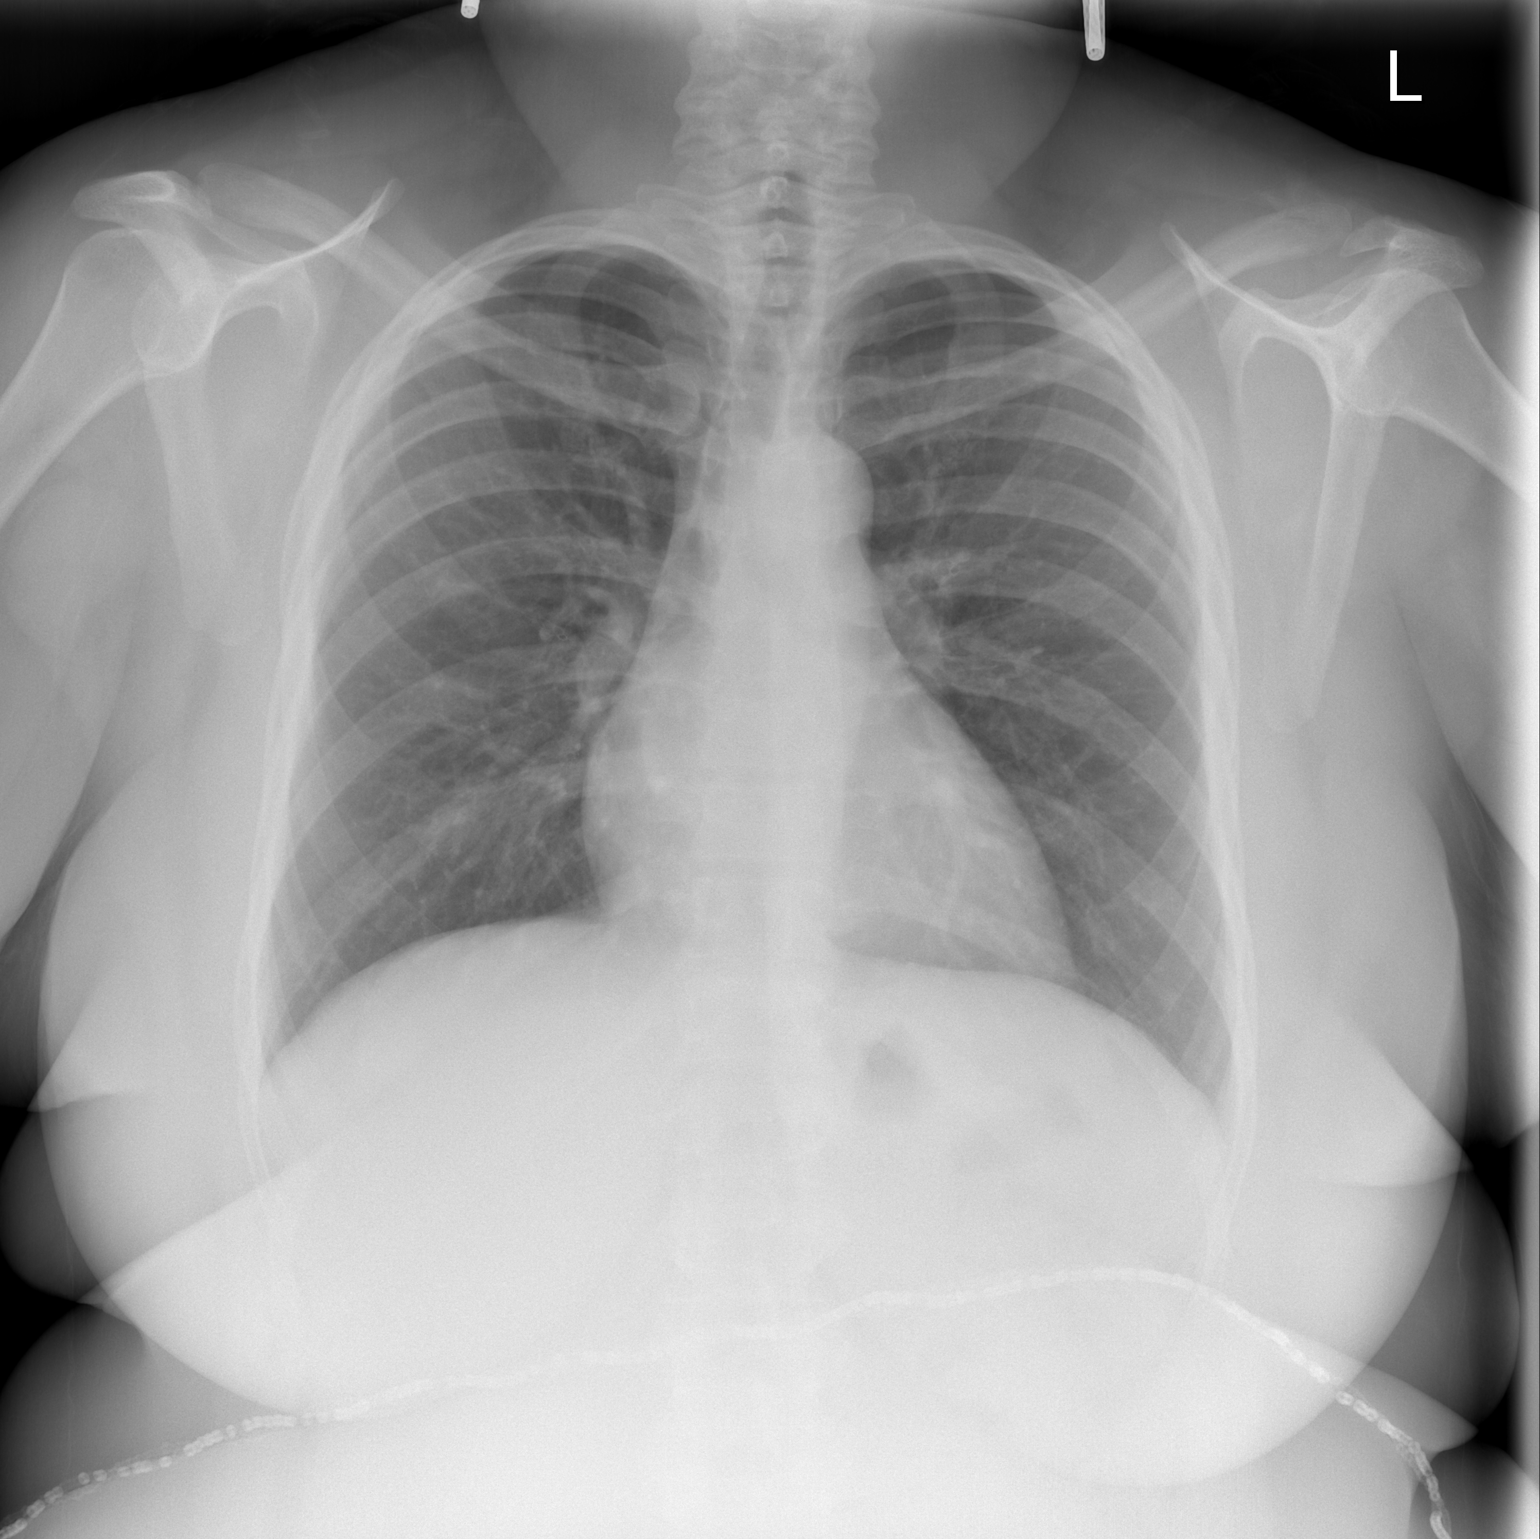

[w chest lat]
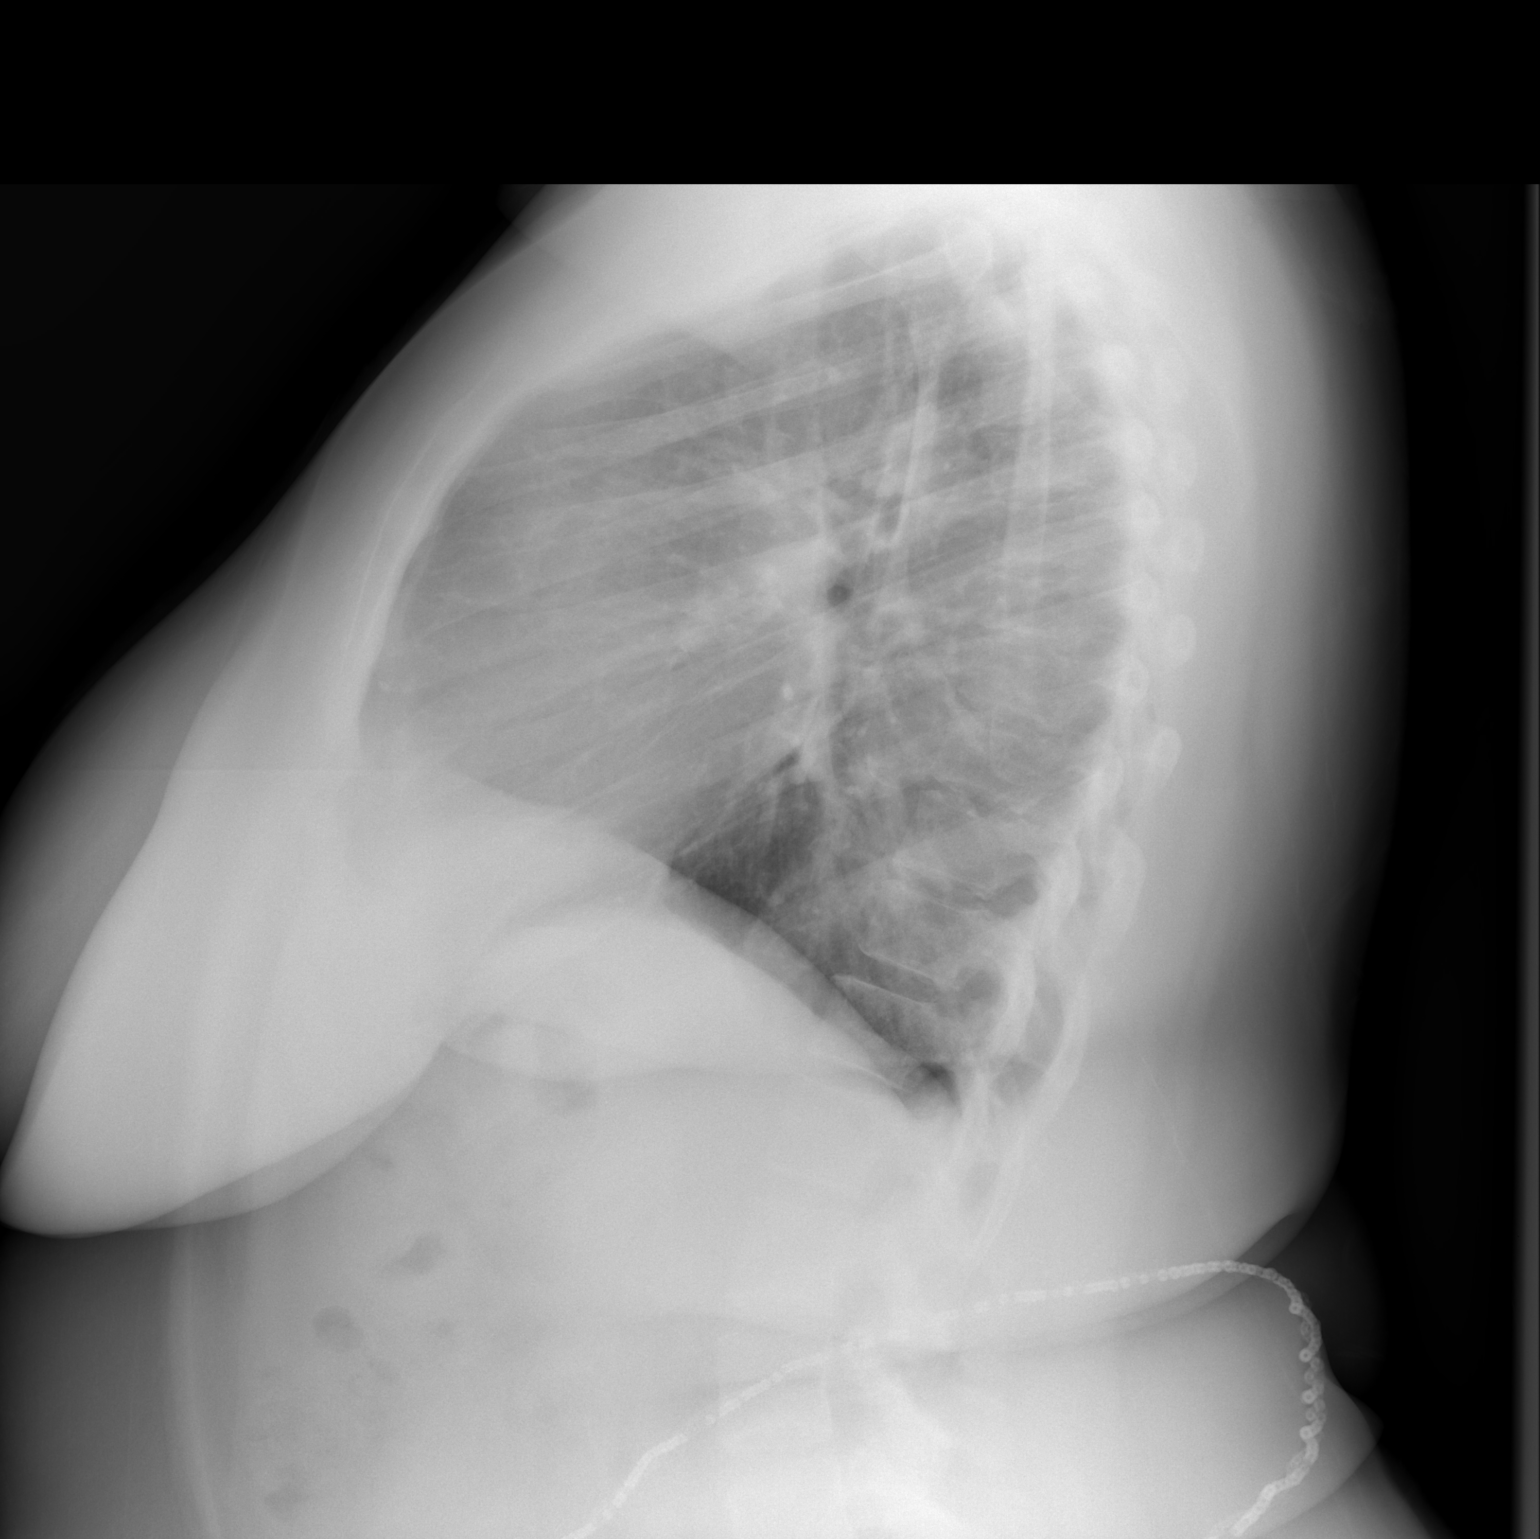

[2 of 2 positions shown; findings below may reference images not displayed]

FINDINGS: The heart size and mediastinal contours are within normal limits.
Both lungs are clear. The visualized skeletal structures are
unremarkable.
IMPRESSION: No active cardiopulmonary disease.

## 2020-01-17 ENCOUNTER — Other Ambulatory Visit: Payer: Self-pay

## 2020-01-17 ENCOUNTER — Encounter (HOSPITAL_COMMUNITY): Payer: Self-pay | Admitting: Emergency Medicine

## 2020-01-17 ENCOUNTER — Ambulatory Visit (HOSPITAL_COMMUNITY)
Admission: EM | Admit: 2020-01-17 | Discharge: 2020-01-17 | Disposition: A | Discharge status: Home / Self Care | Payer: BC Managed Care – PPO | Attending: Physician Assistant | Admitting: Physician Assistant

## 2020-01-17 DIAGNOSIS — H109 Unspecified conjunctivitis: Secondary | ICD-10-CM | POA: Diagnosis not present

## 2020-01-17 MED ORDER — CIPROFLOXACIN HCL 0.3 % OP SOLN: 1.0000 [drp] | OPHTHALMIC | 0 refills | Status: AC

## 2020-01-17 NOTE — Discharge Instructions (Signed)
Use eye drops as prescribed.  Discontinue contact use until resolution of sx.  Can use a warm wash cloth to clean the eye as needed.  Wash hands.

## 2020-01-17 NOTE — ED Triage Notes (Signed)
Patient presents to urgent care today with symptoms of right  Eye conjuctivitis. Symptoms began on yesterday, she states this morning her eye was matted close. They have tried antibiotic eye drops with some relief of symptoms.

## 2020-01-17 NOTE — ED Provider Notes (Signed)
[facility]    CSN: [id number] Arrival date & time: 01/17/20  1609      History   Chief Complaint Chief Complaint  Patient presents with  . Conjunctivitis    HPI Camaya Gannett is a 44 y.o. female.   Pt complains of right eye redness and drainage that started yesterday.  She reports when she woke up this morning the eye was matted shut, yellow crusting.  Denies fever, chills, nasal congestion, visual disturbances.  She wears contacts and has not been wearing contacts or eye makeup since onset      Past Medical History:  Diagnosis Date  . Anemia   . Asthma   . Complication of anesthesia    Scarring of the esophagus  . Hypertension   . Hypothyroidism     Patient Active Problem List   Diagnosis Date Noted  . Postoperative hypothyroidism 03/11/2019  . Neoplasm of uncertain behavior of thyroid gland 11/18/2018  . S/P fine needle biopsy 08/26/2018  . Thyroid nodule 08/26/2018  . Primary osteoarthritis of both knees 10/20/2015  . Pes planus 10/12/2015  . Accessory skin tags 10/12/2015    Past Surgical History:  Procedure Laterality Date  . D & C    . hystrectomy    . THYROID LOBECTOMY Right 01/01/2019   Procedure: RIGHT THYROID LOBECTOMY;  Surgeon: Armandina Gemma, MD;  Location: [facility];  Service: General;  Laterality: Right;    OB History   No obstetric history on file.      Home Medications    Prior to Admission medications   Medication Sig Start Date End Date Taking? Authorizing Provider  levothyroxine (SYNTHROID) 100 MCG tablet Take 1 tablet (100 mcg total) by mouth daily. 07/27/19  Yes Shamleffer, Melanie Crazier, MD  lisinopril (ZESTRIL) 10 MG tablet Take 10 mg by mouth daily.   Yes [provider]  phentermine (ADIPEX-P) 37.5 MG tablet Take 37.5 mg by mouth daily. 07/17/19  Yes [provider]  albuterol (VENTOLIN HFA) 108 (90 Base) MCG/ACT inhaler Inhale 1-2 puffs into the lungs every 6 (six) hours as needed for wheezing or  shortness of breath.    [provider]  hydrochlorothiazide (MICROZIDE) 12.5 MG capsule Take 12.5 mg by mouth daily.    [provider]    Family History Family History  Problem Relation Age of Onset  . Uterine cancer Mother   . Diabetes Father   . Hypertension Father     Social History Social History   Tobacco Use  . Smoking status: Never Smoker  . Smokeless tobacco: Never Used  Vaping Use  . Vaping Use: Never used  Substance Use Topics  . Alcohol use: Yes    Comment: occas  . Drug use: Never     Allergies   Norethindrone   Review of Systems Review of Systems   Physical Exam Triage Vital Signs ED Triage Vitals  Enc Vitals Group     BP 01/17/20 1753 (!) 152/90     Pulse Rate 01/17/20 1753 64     Resp 01/17/20 1753 16     Temp 01/17/20 1753 99.1 F (37.3 C)     Temp Source 01/17/20 1753 Oral     SpO2 01/17/20 1753 100 %     Weight --      Height --      Head Circumference --      Peak Flow --      Pain Score 01/17/20 1755 0     Pain Loc --  Pain Edu? --      Excl. in [place]? --    No data found.  Updated Vital Signs BP (!) 152/90 (BP Location: Right Arm)   Pulse 64   Temp 99.1 F (37.3 C) (Oral)   Resp 16   SpO2 100%   Visual Acuity Right Eye Distance:   Left Eye Distance:   Bilateral Distance:    Right Eye Near:   Left Eye Near:    Bilateral Near:     Physical Exam Vitals and nursing note reviewed.  Constitutional:      General: She is not in acute distress.    Appearance: She is well-developed.  HENT:     Head: Normocephalic and atraumatic.  Eyes:     General: Lids are normal. Vision grossly intact. No visual field deficit.       Right eye: Discharge present. No foreign body or hordeolum.     Extraocular Movements:     Right eye: Normal extraocular motion and no nystagmus.     Conjunctiva/sclera:     Right eye: Right conjunctiva is injected. No chemosis, exudate or hemorrhage. Cardiovascular:     Rate and  Rhythm: Normal rate and regular rhythm.     Heart sounds: No murmur heard.   Pulmonary:     Effort: Pulmonary effort is normal. No respiratory distress.     Breath sounds: Normal breath sounds.  Abdominal:     Palpations: Abdomen is soft.     Tenderness: There is no abdominal tenderness.  Musculoskeletal:     Cervical back: Neck supple.  Skin:    General: Skin is warm and dry.  Neurological:     Mental Status: She is alert.      UC Treatments / Results  Labs (all labs ordered are listed, but only abnormal results are displayed) Labs Reviewed - No data to display  EKG   Radiology No results found.  Procedures Procedures (including critical care time)  Medications Ordered in UC Medications - No data to display  Initial Impression / Assessment and Plan / UC Course  I have reviewed the triage vital signs and the nursing notes.  Pertinent labs & imaging results that were available during my care of the patient were reviewed by me and considered in my medical decision making (see chart for details).     Bacterial conjunctivitis.  Will send in cipro ophthalmic drops, pt wears contacts.  Pt advised to discontinue contact use and any eye makeup or products until sx resolve.  Denies visual disturbances, normal EOM, visual fields intact.   Final Clinical Impressions(s) / UC Diagnoses   Final diagnoses:  None   Discharge Instructions   None    ED Prescriptions    None     PDMP not reviewed this encounter.   Konrad Felix, PA-C 01/17/20 1828

## 2020-01-24 ENCOUNTER — Ambulatory Visit: Payer: BC Managed Care – PPO | Admitting: Internal Medicine

## 2020-02-11 DIAGNOSIS — M7751 Other enthesopathy of right foot: Secondary | ICD-10-CM | POA: Diagnosis not present

## 2020-02-11 DIAGNOSIS — M65871 Other synovitis and tenosynovitis, right ankle and foot: Secondary | ICD-10-CM | POA: Diagnosis not present

## 2020-02-11 DIAGNOSIS — M722 Plantar fascial fibromatosis: Secondary | ICD-10-CM | POA: Diagnosis not present

## 2020-02-11 DIAGNOSIS — M79671 Pain in right foot: Secondary | ICD-10-CM | POA: Diagnosis not present

## 2020-03-24 ENCOUNTER — Ambulatory Visit (INDEPENDENT_AMBULATORY_CARE_PROVIDER_SITE_OTHER): Payer: BC Managed Care – PPO | Admitting: Internal Medicine

## 2020-03-24 ENCOUNTER — Encounter: Payer: Self-pay | Admitting: Internal Medicine

## 2020-03-24 ENCOUNTER — Other Ambulatory Visit: Payer: Self-pay

## 2020-03-24 VITALS — BP 126/82 | HR 61 | Ht 66.0 in | Wt 244.0 lb

## 2020-03-24 DIAGNOSIS — E890 Postprocedural hypothyroidism: Secondary | ICD-10-CM

## 2020-03-24 LAB — TSH: TSH: 13.45 u[IU]/mL — ABNORMAL HIGH (ref 0.35–4.50)

## 2020-03-24 NOTE — Progress Notes (Signed)
Name: Denise Evans  MRN/ DOB: [id number], 05/11/1976    Age/ Sex: 44 y.o., female     PCP: Denise Evans, [place]   Reason for Endocrinology Evaluation: Thyroid Nodule      Initial Endocrinology Clinic Visit: 08/25/2018    PATIENT IDENTIFIER: [name] is a 44 y.o., female with a past medical history of Asthma, and HTN . She has followed with [facility] since 08/25/2018 for consultative assistance with management of her  Thyroid Nodule.  HISTORICAL SUMMARY: The patient was noted to have local neck enlargement on routine exam, this prompted a thyroid ultrasound which was performed on November,8th 2019, and revealed thyroid nodule requiring biopsy, images and report not available at this time. She did have an ultrasound guided FNA on 06/01/2018 of the right inferior thyroid nodule which is hypoechoic measuring 3.0 x 2.6 x 1.8 cm .  Cytology report consistent with atypia of undetermined significance (Bethesda category III), no molecular testing available.    S/P right lobectomy 01/01/2019  Levothyroxine started 02/2019  SUBJECTIVE:   Today (03/24/2020):  Denise Evans is here for a  follow up . She is S/P  right thyroid lobectomy in 12/2018.    She continued to have chronic cold intolerance Imprved constipation  Denies depression  No local neck symptoms    Levothyroxine makes her hungry    HISTORY:  Past Medical History:  Past Medical History:  Diagnosis Date   Anemia    Asthma    Complication of anesthesia    Scarring of the esophagus   Hypertension    Hypothyroidism    Past Surgical History:  Past Surgical History:  Procedure Laterality Date   D & C     hystrectomy     THYROID LOBECTOMY Right 01/01/2019   Procedure: RIGHT THYROID LOBECTOMY;  Surgeon: Armandina Gemma, MD;  Location: [facility];  Service: General;  Laterality: Right;    Social History:  reports that she has never smoked. She has never used smokeless tobacco. She reports current  alcohol use. She reports that she does not use drugs. Family History:  Family History  Problem Relation Age of Onset   Uterine cancer Mother    Diabetes Father    Hypertension Father      HOME MEDICATIONS: Allergies as of 03/24/2020      Reactions   Norethindrone Other (See Comments)   Liver enzymes off      Medication List       Accurate as of March 24, 2020 12:50 PM. If you have any questions, ask your nurse or doctor.        albuterol 108 (90 Base) MCG/ACT inhaler Commonly known as: VENTOLIN HFA Inhale 1-2 puffs into the lungs every 6 (six) hours as needed for wheezing or shortness of breath.   ciprofloxacin 0.3 % ophthalmic solution Commonly known as: Ciloxan Place 1-2 drops into the right eye every 2 (two) hours. Administer 1 drop, every 2 hours, while awake, for 2 days. Then 1 drop, every 4 hours, while awake, for the next 5 days.   hydrochlorothiazide 12.5 MG capsule Commonly known as: MICROZIDE Take 12.5 mg by mouth daily.   levothyroxine 100 MCG tablet Commonly known as: SYNTHROID Take 1 tablet (100 mcg total) by mouth daily.   lisinopril 10 MG tablet Commonly known as: ZESTRIL Take 10 mg by mouth daily.   phentermine 37.5 MG tablet Commonly known as: ADIPEX-P Take 37.5 mg by mouth daily.      PHYSICAL EXAM:  VS: There were no vitals taken for this visit.   EXAM: General: Pt appears well and is in NAD  Neck: General: Supple without adenopathy. Thyroid: Thyroid size is normal on left.   Lungs: Clear with good BS bilat with no rales, rhonchi, or wheezes  Heart: Auscultation: RRR with normal S1 and S2, no gallops or murmurs  Abdomen: Normoactive bowel sounds, soft, nontender, without masses or organomegaly palpable  Lymphatics:   Extremities:  BL LE: no pretibial edema  Mental Status: Judgment, insight: intact Orientation: oriented to time, place, and person Mood and affect: no depression, anxiety, or agitation      DATA  REVIEWED:  Results for Denise Evans, Denise Evans (MRN [id number]) as of 03/27/2020 12:29  Ref. Range 03/24/2020 14:13  TSH Latest Ref Range: 0.35 - 4.50 uIU/mL 13.45 (H)    Thyroid Pathology 01/01/2019  Thyroid, lobectomy, right thyroid lobe - THYROID GLAND WITH NODULAR HYPERPLASIA - ADENOMA WITH HURTHLE CELL FEATURES (0.5 CM) - BENIGN PARATHYROID   ASSESSMENT / PLAN / RECOMMENDATIONS:   1. Post-Operative Hypothyroidism:  - Clinically is euthyroid  - Pt educated extensively on the correct way to take levothyroxine (first thing in the morning with water, 30 minutes before eating or taking other medications). - Pt encouraged to double dose the following day if she were to miss a dose given long half-life of levothyroxine.  Medications:  Stop Levothyroxine 100 mcg  Start levothyroxine 125 mcg daily     F/u 6 months     Signed electronically by: Denise Guise, MD  [facility]  [facility] [address] [place], [place] [address] Phone: [phone] FAX: [phone]   CC: Denise Evans, [address] [place] [address] Phone: [phone] Fax: [phone]   Return to Endocrinology clinic as below: Future Appointments  Date Time Provider [place]  03/24/2020  1:40 PM Denise Evans, Denise Crazier, MD [facility] None

## 2020-03-24 NOTE — Patient Instructions (Signed)

## 2020-03-27 MED ORDER — LEVOTHYROXINE SODIUM 125 MCG PO TABS: 125.0000 ug | ORAL_TABLET | Freq: Every day | ORAL | 3 refills | Status: AC

## 2020-05-22 ENCOUNTER — Other Ambulatory Visit: Payer: BC Managed Care – PPO

## 2020-12-12 DIAGNOSIS — H10021 Other mucopurulent conjunctivitis, right eye: Secondary | ICD-10-CM | POA: Diagnosis not present

## 2021-01-17 DIAGNOSIS — J069 Acute upper respiratory infection, unspecified: Secondary | ICD-10-CM | POA: Diagnosis not present

## 2021-03-26 ENCOUNTER — Ambulatory Visit: Payer: BC Managed Care – PPO | Admitting: Internal Medicine

## 2021-03-26 NOTE — Progress Notes (Deleted)
Name: Denise Evans  MRN/ DOB: [id number], 05-12-1976    Age/ Sex: 45 y.o., female     PCP: Assunta Curtis, [place]   Reason for Endocrinology Evaluation: Thyroid Nodule      Initial Endocrinology Clinic Visit: 08/25/2018    PATIENT IDENTIFIER: [name] is a 45 y.o., female with a past medical history of Asthma, and HTN . She has followed with [facility] since 08/25/2018 for consultative assistance with management of her  Thyroid Nodule.  HISTORICAL SUMMARY: The patient was noted to have local neck enlargement on routine exam, this prompted a thyroid ultrasound which was performed on November,8th 2019, and revealed thyroid nodule requiring biopsy, images and report not available at this time. She did have an ultrasound guided FNA on 06/01/2018 of the right inferior thyroid nodule which is hypoechoic measuring 3.0 x 2.6 x 1.8 cm .  Cytology report consistent with atypia of undetermined significance (Bethesda category III), no molecular testing available.    S/P right lobectomy 01/01/2019 with benign pathology reports and Hurthle cell features   Levothyroxine started 02/2019  SUBJECTIVE:   Today (03/26/2021):  Ms. [name] is here for a  follow up . She is S/P  right thyroid lobectomy in 12/2018.    She continued to have chronic cold intolerance Imprved constipation  Denies depression  No local neck symptoms    Levothyroxine 125 mcg daily    HISTORY:  Past Medical History:  Past Medical History:  Diagnosis Date   Anemia    Asthma    Complication of anesthesia    Scarring of the esophagus   Hypertension    Hypothyroidism    Past Surgical History:  Past Surgical History:  Procedure Laterality Date   D & C     hystrectomy     THYROID LOBECTOMY Right 01/01/2019   Procedure: RIGHT THYROID LOBECTOMY;  Surgeon: Armandina Gemma, MD;  Location: [facility];  Service: General;  Laterality: Right;   Social History:  reports that she has never smoked. She has never  used smokeless tobacco. She reports current alcohol use. She reports that she does not use drugs. Family History:  Family History  Problem Relation Age of Onset   Uterine cancer Mother    Diabetes Father    Hypertension Father      HOME MEDICATIONS: Allergies as of 03/26/2021       Reactions   Norethindrone Other (See Comments)   Liver enzymes off        Medication List        Accurate as of March 26, 2021  7:14 AM. If you have any questions, ask your nurse or doctor.          albuterol 108 (90 Base) MCG/ACT inhaler Commonly known as: VENTOLIN HFA Inhale 1-2 puffs into the lungs every 6 (six) hours as needed for wheezing or shortness of breath.   ciprofloxacin 0.3 % ophthalmic solution Commonly known as: Ciloxan Place 1-2 drops into the right eye every 2 (two) hours. Administer 1 drop, every 2 hours, while awake, for 2 days. Then 1 drop, every 4 hours, while awake, for the next 5 days.   hydrochlorothiazide 12.5 MG capsule Commonly known as: MICROZIDE Take 12.5 mg by mouth daily.   levothyroxine 125 MCG tablet Commonly known as: SYNTHROID Take 1 tablet (125 mcg total) by mouth daily.   lisinopril 10 MG tablet Commonly known as: ZESTRIL Take 10 mg by mouth daily.   phentermine 37.5 MG tablet Commonly known  as: ADIPEX-P Take 37.5 mg by mouth daily.       PHYSICAL EXAM: VS: There were no vitals taken for this visit.   EXAM: General: Pt appears well and is in NAD  Neck: General: Supple without adenopathy. Thyroid: Thyroid size is normal on left.   Lungs: Clear with good BS bilat with no rales, rhonchi, or wheezes  Heart: Auscultation: RRR with normal S1 and S2, no gallops or murmurs  Abdomen: Normoactive bowel sounds, soft, nontender, without masses or organomegaly palpable  Lymphatics:   Extremities:  BL LE: no pretibial edema  Mental Status: Judgment, insight: intact Orientation: oriented to time, place, and person Mood and affect: no depression,  anxiety, or agitation      DATA REVIEWED:  Results for HILDEGARD, HLAVAC (MRN [id number]) as of 03/27/2020 12:29  Ref. Range 03/24/2020 14:13  TSH Latest Ref Range: 0.35 - 4.50 uIU/mL 13.45 (H)    Thyroid Pathology 01/01/2019  Thyroid, lobectomy, right thyroid lobe - THYROID GLAND WITH NODULAR HYPERPLASIA - ADENOMA WITH HURTHLE CELL FEATURES (0.5 CM) - BENIGN PARATHYROID   ASSESSMENT / PLAN / RECOMMENDATIONS:   1. Post-Operative Hypothyroidism:  - Clinically is euthyroid  - Pt educated extensively on the correct way to take levothyroxine (first thing in the morning with water, 30 minutes before eating or taking other medications). - Pt encouraged to double dose the following day if she were to miss a dose given long half-life of levothyroxine.  Medications:  Start levothyroxine 125 mcg daily     F/u 6 months     Signed electronically by: Mack Guise, MD  [facility]  [facility] [address] [place], [place] [address] Phone: [phone] FAX: [phone]   CC: Assunta Curtis, [address] [place] [address] Phone: [phone] Fax: [phone]   Return to Endocrinology clinic as below: Future Appointments  Date Time Provider [place]  03/26/2021  8:30 AM Mileidy Atkin, Melanie Crazier, MD [facility] None

## 2021-06-26 DIAGNOSIS — Z01818 Encounter for other preprocedural examination: Secondary | ICD-10-CM | POA: Diagnosis not present

## 2021-07-04 DIAGNOSIS — N3000 Acute cystitis without hematuria: Secondary | ICD-10-CM | POA: Diagnosis not present

## 2022-07-22 DIAGNOSIS — I10 Essential (primary) hypertension: Secondary | ICD-10-CM | POA: Diagnosis not present

## 2022-07-22 DIAGNOSIS — H66002 Acute suppurative otitis media without spontaneous rupture of ear drum, left ear: Secondary | ICD-10-CM | POA: Diagnosis not present

## 2022-07-22 DIAGNOSIS — T3695XA Adverse effect of unspecified systemic antibiotic, initial encounter: Secondary | ICD-10-CM | POA: Diagnosis not present

## 2022-07-22 DIAGNOSIS — B379 Candidiasis, unspecified: Secondary | ICD-10-CM | POA: Diagnosis not present

## 2022-09-02 ENCOUNTER — Ambulatory Visit: Payer: Self-pay

## 2022-09-11 DIAGNOSIS — K8010 Calculus of gallbladder with chronic cholecystitis without obstruction: Secondary | ICD-10-CM | POA: Diagnosis not present

## 2022-09-11 DIAGNOSIS — K8020 Calculus of gallbladder without cholecystitis without obstruction: Secondary | ICD-10-CM | POA: Diagnosis not present

## 2022-09-11 DIAGNOSIS — I10 Essential (primary) hypertension: Secondary | ICD-10-CM | POA: Diagnosis not present

## 2022-09-11 DIAGNOSIS — K828 Other specified diseases of gallbladder: Secondary | ICD-10-CM | POA: Diagnosis not present

## 2022-09-11 DIAGNOSIS — E039 Hypothyroidism, unspecified: Secondary | ICD-10-CM | POA: Diagnosis not present

## 2022-09-11 DIAGNOSIS — K819 Cholecystitis, unspecified: Secondary | ICD-10-CM | POA: Diagnosis not present

## 2022-09-11 DIAGNOSIS — D62 Acute posthemorrhagic anemia: Secondary | ICD-10-CM | POA: Diagnosis not present

## 2022-09-11 DIAGNOSIS — K810 Acute cholecystitis: Secondary | ICD-10-CM | POA: Diagnosis not present

## 2022-09-12 DIAGNOSIS — I10 Essential (primary) hypertension: Secondary | ICD-10-CM | POA: Diagnosis not present

## 2022-09-12 DIAGNOSIS — K8010 Calculus of gallbladder with chronic cholecystitis without obstruction: Secondary | ICD-10-CM | POA: Diagnosis not present

## 2022-09-12 DIAGNOSIS — D62 Acute posthemorrhagic anemia: Secondary | ICD-10-CM | POA: Diagnosis not present

## 2022-09-12 DIAGNOSIS — E039 Hypothyroidism, unspecified: Secondary | ICD-10-CM | POA: Diagnosis not present

## 2022-09-13 DIAGNOSIS — K810 Acute cholecystitis: Secondary | ICD-10-CM | POA: Diagnosis not present

## 2022-09-13 DIAGNOSIS — K8010 Calculus of gallbladder with chronic cholecystitis without obstruction: Secondary | ICD-10-CM | POA: Diagnosis not present

## 2022-09-13 DIAGNOSIS — E039 Hypothyroidism, unspecified: Secondary | ICD-10-CM | POA: Diagnosis not present

## 2022-09-13 DIAGNOSIS — I10 Essential (primary) hypertension: Secondary | ICD-10-CM | POA: Diagnosis not present

## 2022-09-13 DIAGNOSIS — D62 Acute posthemorrhagic anemia: Secondary | ICD-10-CM | POA: Diagnosis not present

## 2022-11-25 DIAGNOSIS — M25562 Pain in left knee: Secondary | ICD-10-CM | POA: Diagnosis not present

## 2022-11-25 DIAGNOSIS — M25561 Pain in right knee: Secondary | ICD-10-CM | POA: Diagnosis not present

## 2023-03-25 DIAGNOSIS — Z760 Encounter for issue of repeat prescription: Secondary | ICD-10-CM | POA: Diagnosis not present

## 2023-05-26 DIAGNOSIS — M170 Bilateral primary osteoarthritis of knee: Secondary | ICD-10-CM | POA: Diagnosis not present

## 2023-07-07 DIAGNOSIS — E039 Hypothyroidism, unspecified: Secondary | ICD-10-CM | POA: Diagnosis not present

## 2023-07-07 DIAGNOSIS — Z0000 Encounter for general adult medical examination without abnormal findings: Secondary | ICD-10-CM | POA: Diagnosis not present

## 2023-07-07 DIAGNOSIS — E66812 Obesity, class 2: Secondary | ICD-10-CM | POA: Diagnosis not present

## 2023-07-07 DIAGNOSIS — Z13220 Encounter for screening for lipoid disorders: Secondary | ICD-10-CM | POA: Diagnosis not present

## 2023-07-07 DIAGNOSIS — I10 Essential (primary) hypertension: Secondary | ICD-10-CM | POA: Diagnosis not present

## 2023-08-21 DIAGNOSIS — M170 Bilateral primary osteoarthritis of knee: Secondary | ICD-10-CM | POA: Diagnosis not present

## 2024-01-21 DIAGNOSIS — M170 Bilateral primary osteoarthritis of knee: Secondary | ICD-10-CM | POA: Diagnosis not present

## 2024-01-21 DIAGNOSIS — M25562 Pain in left knee: Secondary | ICD-10-CM | POA: Diagnosis not present

## 2024-01-21 DIAGNOSIS — M25561 Pain in right knee: Secondary | ICD-10-CM | POA: Diagnosis not present

## 2024-03-18 DIAGNOSIS — Z973 Presence of spectacles and contact lenses: Secondary | ICD-10-CM | POA: Diagnosis not present

## 2024-03-18 DIAGNOSIS — H5789 Other specified disorders of eye and adnexa: Secondary | ICD-10-CM | POA: Diagnosis not present

## 2024-03-19 DIAGNOSIS — H1032 Unspecified acute conjunctivitis, left eye: Secondary | ICD-10-CM | POA: Diagnosis not present
